# Patient Record
Sex: Female | Born: 1960 | Race: White | Hispanic: No | Marital: Married | Smoking: Former smoker
Health system: Southern US, Community
[De-identification: ages and names within clinical notes are randomized; demographics above are authoritative.]

## PROBLEM LIST (undated history)

## (undated) DIAGNOSIS — I639 Cerebral infarction, unspecified: Secondary | ICD-10-CM

## (undated) DIAGNOSIS — M8548 Solitary bone cyst, other site: Secondary | ICD-10-CM

## (undated) DIAGNOSIS — Z6833 Body mass index (BMI) 33.0-33.9, adult: Secondary | ICD-10-CM

## (undated) DIAGNOSIS — K219 Gastro-esophageal reflux disease without esophagitis: Secondary | ICD-10-CM

## (undated) DIAGNOSIS — M549 Dorsalgia, unspecified: Secondary | ICD-10-CM

## (undated) DIAGNOSIS — I341 Nonrheumatic mitral (valve) prolapse: Secondary | ICD-10-CM

## (undated) DIAGNOSIS — M4317 Spondylolisthesis, lumbosacral region: Secondary | ICD-10-CM

## (undated) DIAGNOSIS — M7138 Other bursal cyst, other site: Secondary | ICD-10-CM

## (undated) HISTORY — DX: Spondylolisthesis, lumbosacral region: M43.17

## (undated) HISTORY — PX: WISDOM TOOTH EXTRACTION: SHX21

## (undated) HISTORY — PX: VAGINAL HYSTERECTOMY: SUR661

## (undated) HISTORY — DX: Dorsalgia, unspecified: M54.9

## (undated) HISTORY — DX: Solitary bone cyst, other site: M85.48

## (undated) HISTORY — DX: Body mass index (BMI) 33.0-33.9, adult: Z68.33

## (undated) HISTORY — PX: COLONOSCOPY: SHX174

## (undated) HISTORY — PX: LAPAROSCOPY: SHX197

## (undated) HISTORY — DX: Other bursal cyst, other site: M71.38

---

## 1997-07-17 ENCOUNTER — Inpatient Hospital Stay (HOSPITAL_COMMUNITY): Admission: EM | Admit: 1997-07-17 | Discharge: 1997-07-19 | Payer: Self-pay | Admitting: *Deleted

## 1997-08-09 ENCOUNTER — Ambulatory Visit (HOSPITAL_COMMUNITY): Admission: RE | Admit: 1997-08-09 | Discharge: 1997-08-09 | Payer: Self-pay | Admitting: Neurology

## 1998-10-30 ENCOUNTER — Other Ambulatory Visit: Admission: RE | Admit: 1998-10-30 | Discharge: 1998-10-30 | Payer: Self-pay | Admitting: Family Medicine

## 1999-09-18 ENCOUNTER — Encounter: Payer: Self-pay | Admitting: Family Medicine

## 1999-09-18 ENCOUNTER — Encounter: Admission: RE | Admit: 1999-09-18 | Discharge: 1999-09-18 | Payer: Self-pay | Admitting: Family Medicine

## 2010-01-05 ENCOUNTER — Ambulatory Visit (HOSPITAL_COMMUNITY): Admission: RE | Admit: 2010-01-05 | Discharge: 2010-01-05 | Payer: Self-pay | Admitting: Obstetrics and Gynecology

## 2010-02-17 ENCOUNTER — Encounter (INDEPENDENT_AMBULATORY_CARE_PROVIDER_SITE_OTHER): Payer: Self-pay | Admitting: Obstetrics and Gynecology

## 2010-02-17 ENCOUNTER — Ambulatory Visit (HOSPITAL_COMMUNITY)
Admission: RE | Admit: 2010-02-17 | Discharge: 2010-02-18 | Payer: Self-pay | Source: Home / Self Care | Attending: Obstetrics and Gynecology | Admitting: Obstetrics and Gynecology

## 2010-05-19 LAB — CBC
HCT: 26 % — ABNORMAL LOW (ref 36.0–46.0)
HCT: 34.9 % — ABNORMAL LOW (ref 36.0–46.0)
Hemoglobin: 11.7 g/dL — ABNORMAL LOW (ref 12.0–15.0)
Hemoglobin: 8.8 g/dL — ABNORMAL LOW (ref 12.0–15.0)
MCH: 28.8 pg (ref 26.0–34.0)
MCH: 29.3 pg (ref 26.0–34.0)
MCHC: 33.6 g/dL (ref 30.0–36.0)
MCHC: 33.8 g/dL (ref 30.0–36.0)
MCV: 85.7 fL (ref 78.0–100.0)
MCV: 86.6 fL (ref 78.0–100.0)
Platelets: 218 10*3/uL (ref 150–400)
Platelets: 303 10*3/uL (ref 150–400)
RBC: 3.01 MIL/uL — ABNORMAL LOW (ref 3.87–5.11)
RBC: 4.07 MIL/uL (ref 3.87–5.11)
RDW: 15.2 % (ref 11.5–15.5)
RDW: 15.4 % (ref 11.5–15.5)
WBC: 11.9 10*3/uL — ABNORMAL HIGH (ref 4.0–10.5)
WBC: 6.8 10*3/uL (ref 4.0–10.5)

## 2010-05-19 LAB — PREGNANCY, URINE: Preg Test, Ur: NEGATIVE

## 2010-05-19 LAB — SURGICAL PCR SCREEN
MRSA, PCR: NEGATIVE
Staphylococcus aureus: NEGATIVE

## 2010-05-20 LAB — CBC
HCT: 38.4 % (ref 36.0–46.0)
MCHC: 33.3 g/dL (ref 30.0–36.0)
MCV: 85 fL (ref 78.0–100.0)
Platelets: 303 10*3/uL (ref 150–400)
RDW: 15.2 % (ref 11.5–15.5)
WBC: 5.9 10*3/uL (ref 4.0–10.5)

## 2010-05-20 LAB — PREGNANCY, URINE: Preg Test, Ur: NEGATIVE

## 2011-01-15 ENCOUNTER — Other Ambulatory Visit: Payer: Self-pay | Admitting: Obstetrics and Gynecology

## 2013-07-19 ENCOUNTER — Other Ambulatory Visit (HOSPITAL_BASED_OUTPATIENT_CLINIC_OR_DEPARTMENT_OTHER): Payer: Self-pay | Admitting: Physician Assistant

## 2013-07-19 DIAGNOSIS — R1310 Dysphagia, unspecified: Secondary | ICD-10-CM

## 2013-07-19 DIAGNOSIS — E038 Other specified hypothyroidism: Secondary | ICD-10-CM

## 2013-08-03 ENCOUNTER — Ambulatory Visit (HOSPITAL_BASED_OUTPATIENT_CLINIC_OR_DEPARTMENT_OTHER): Payer: 59

## 2013-08-09 ENCOUNTER — Other Ambulatory Visit: Payer: Self-pay | Admitting: Family Medicine

## 2013-08-09 DIAGNOSIS — R1310 Dysphagia, unspecified: Secondary | ICD-10-CM

## 2013-08-09 DIAGNOSIS — E038 Other specified hypothyroidism: Secondary | ICD-10-CM

## 2013-08-13 ENCOUNTER — Other Ambulatory Visit: Payer: 59

## 2013-08-17 ENCOUNTER — Ambulatory Visit
Admission: RE | Admit: 2013-08-17 | Discharge: 2013-08-17 | Disposition: A | Discharge status: Home / Self Care | Payer: 59 | Source: Ambulatory Visit | Attending: Family Medicine | Admitting: Family Medicine

## 2013-08-17 DIAGNOSIS — R1310 Dysphagia, unspecified: Secondary | ICD-10-CM

## 2013-08-17 DIAGNOSIS — E038 Other specified hypothyroidism: Secondary | ICD-10-CM

## 2014-02-15 ENCOUNTER — Other Ambulatory Visit: Payer: Self-pay | Admitting: Obstetrics and Gynecology

## 2014-02-18 LAB — CYTOLOGY - PAP

## 2016-02-12 ENCOUNTER — Ambulatory Visit (INDEPENDENT_AMBULATORY_CARE_PROVIDER_SITE_OTHER): Payer: Self-pay | Admitting: Orthopaedic Surgery

## 2017-10-20 ENCOUNTER — Ambulatory Visit (INDEPENDENT_AMBULATORY_CARE_PROVIDER_SITE_OTHER): Payer: Self-pay | Admitting: Orthopaedic Surgery

## 2017-11-23 ENCOUNTER — Other Ambulatory Visit: Payer: Self-pay | Admitting: Physician Assistant

## 2017-11-23 DIAGNOSIS — M545 Low back pain: Secondary | ICD-10-CM

## 2017-12-01 ENCOUNTER — Other Ambulatory Visit: Payer: Self-pay | Admitting: Physician Assistant

## 2017-12-01 DIAGNOSIS — S0542XA Penetrating wound of orbit with or without foreign body, left eye, initial encounter: Principal | ICD-10-CM

## 2017-12-01 DIAGNOSIS — S0541XA Penetrating wound of orbit with or without foreign body, right eye, initial encounter: Secondary | ICD-10-CM

## 2017-12-10 ENCOUNTER — Ambulatory Visit
Admission: RE | Admit: 2017-12-10 | Discharge: 2017-12-10 | Disposition: A | Discharge status: Home / Self Care | Payer: 59 | Source: Ambulatory Visit | Attending: Physician Assistant | Admitting: Physician Assistant

## 2017-12-10 ENCOUNTER — Other Ambulatory Visit: Payer: Self-pay

## 2017-12-10 DIAGNOSIS — S0542XA Penetrating wound of orbit with or without foreign body, left eye, initial encounter: Principal | ICD-10-CM

## 2017-12-10 DIAGNOSIS — S0541XA Penetrating wound of orbit with or without foreign body, right eye, initial encounter: Secondary | ICD-10-CM

## 2017-12-10 DIAGNOSIS — M545 Low back pain, unspecified: Secondary | ICD-10-CM

## 2018-01-30 ENCOUNTER — Telehealth: Payer: Self-pay

## 2018-01-30 NOTE — Telephone Encounter (Signed)
SENT REFERRAL TO SCHEDULING AND FILED NOTES 

## 2018-01-31 ENCOUNTER — Telehealth: Payer: Self-pay | Admitting: Cardiovascular Disease

## 2018-01-31 NOTE — Telephone Encounter (Signed)
New Message             [facility] Pre-operative Risk Assessment    Request for surgical clearance:  1. What type of surgery is being performed? Thoracic Spine Surgery   2. When is this surgery scheduled? Pending on clearance  What type of clearance is required (medical clearance vs. Pharmacy clearance to hold med vs. Both)? Neither  3. Are there any medications that need to be held prior to surgery and how long? None  4. Practice name and name of physician performing surgery? [facility]/Dr. Ophelia Charter  5. What is your office phone number [phone]   7.   What is your office fax number [phone]  8.   Anesthesia type (None, local, MAC, general) ? General   Erica Moss 01/31/2018, 10:24 AM  _________________________________________________________________   (provider comments below)

## 2018-01-31 NOTE — Telephone Encounter (Signed)
   Primary Cardiologist:  None   Chart reviewed as part of pre-operative protocol coverage.   This is a new patient. She will need an appointment before clearance can be provided.  It looks like a referral may have already been made.  Call back staff: 1. Please make sure the patient is getting set up for a NEW PATIENT appointment. 2. Route this note to the provider that sees her so that he/she can address surgical clearance at the time of the appointment.  This note will be removed from the Preop Pool. Tereso NewcomerScott Errin Chewning, PA-C  01/31/2018, 3:18 PM

## 2018-01-31 NOTE — Telephone Encounter (Signed)
LMOM for pt to call our to schedule new patient appt.

## 2018-02-01 NOTE — Telephone Encounter (Signed)
LMOVM TO CONTACT OFFICE TO SET APPOINTMENT WITH A CARDIOLOGIST

## 2018-02-07 NOTE — Progress Notes (Signed)
Cardiology Office Note   Date:  02/09/2018   ID:  Erica BrackenMichelle O Win, DOB 1960-08-12, MRN [id number]  PCP:  No primary care provider on file.  Cardiologist:   No primary care provider on file. Referring:  Tressie StalkerJenkins, Jeffrey, MD  Chief Complaint  Patient presents with  . Chest Pain    Tightness.      History of Present Illness: Erica Moss is a 57 y.o. female who is referred by Tressie StalkerJenkins, Jeffrey, MD for preop clearance prior to spine surgery.  She has no past cardiac history other than questionable mitral valve prolapse years ago.  She said she had a stroke about 25 years ago but it might of been related to she was smoking cigarettes and using birth control pills.  She had no residual from this.  She is otherwise had no cardiac work-up.  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  Because she was describing some chest discomfort and palpitation she was referred for preop evaluation.  She has lots of stress with her husband going through some health issues.  When she hears about these or thinks about these her heart will start racing and she might get short of breath.  It seems to be getting increased in frequency and intensity but so is the stress.  She elected passed away on its own.  She has not take any medications for this.  She denies any presyncope or syncope.  She does get some vague chest discomfort.  This is sporadic.  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  However, she has cardiovascular risk factors as described below.   Past Medical History:  Diagnosis Date  . Back pain   . Body mass index (bmi) 33.0-33.9, adult   . Cyst of thoracic facet joint   . Spondylolisthesis at L5-S1 level     Past Surgical History:  Procedure Laterality Date  . VAGINAL HYSTERECTOMY       No current outpatient medications on file.   No current  facility-administered medications for this visit.     Allergies:   Patient has no known allergies.    Social History:  The patient  reports that she has quit smoking. She has never used smokeless tobacco.   Family History:  The patient's family history includes Diabetes in her father; Heart attack (age of onset: 5550) in her father; Heart disease in her father; Heart failure in her sister; Kidney disease in her mother.    ROS:  Please see the history of present illness.   Otherwise, review of systems are positive for none.   All other systems are reviewed and negative.    PHYSICAL EXAM: VS:  BP (!) 140/92 (BP Location: Left Arm, Patient Position: Sitting, Cuff Size: Normal)   Pulse 89   Ht 5\' 2"  (1.575 m)   Wt 182 lb (82.6 kg)   BMI 33.29 kg/m  , BMI Body mass index is 33.29 kg/m. GENERAL:  Well appearing HEENT:  Pupils equal round and reactive, fundi not visualized, oral mucosa unremarkable NECK:  No jugular venous distention, waveform within normal limits, carotid upstroke brisk and symmetric, no bruits, no thyromegaly LYMPHATICS:  No cervical, inguinal adenopathy LUNGS:  Clear to auscultation bilaterally BACK:  No CVA tenderness CHEST:  Unremarkable HEART:  PMI not displaced or sustained,S1 and S2 within normal  limits, no S3, no S4, no clicks, no rubs, no murmurs ABD:  Flat, positive bowel sounds normal in frequency in pitch, no bruits, no rebound, no guarding, no midline pulsatile mass, no hepatomegaly, no splenomegaly EXT:  2 plus pulses throughout, no edema, no cyanosis no clubbing SKIN:  No rashes no nodules NEURO:  Cranial nerves II through XII grossly intact, motor grossly intact throughout PSYCH:  Cognitively intact, oriented to person place and time    EKG:  EKG is ordered today. The ekg ordered today demonstrates sinus rhythm, rate 90, axis within normal limits, intervals within normal limits, no acute ST-T wave changes.   Recent Labs: No results found for  requested labs within last 8760 hours.    Lipid Panel No results found for: CHOL, TRIG, HDL, CHOLHDL, VLDL, LDLCALC, LDLDIRECT    Wt Readings from Last 3 Encounters:  02/09/18 182 lb (82.6 kg)      Other studies Reviewed: Additional studies/ records that were reviewed today include: Labs. Review of the above records demonstrates:  Please see elsewhere in the note.     ASSESSMENT AND PLAN:  PREOP:   I will bring the patient back for a POET (Plain Old Exercise Test). This will allow me to screen for obstructive coronary disease, risk stratify and very importantly provide a prescription for exercise.  CHEST PAIN:  As above  HTN: This is unusual and typically not this elevated and she is going to keep an eye on this.  No change in medications at this point.  PALPITATIONS:  This is probably related to acute stress and anxiety.  At this point I do not think further cardiovascular testing is suggested but if it persists I could consider telemetry.  DYSLIPIDEMIA: She has a family history of early coronary artery disease.  While I do not think she needs medication therapy as an initial intervention she needs diet.  I would suggest the Mediterranean diet and she and I discussed this.   Current medicines are reviewed at length with the patient today.  The patient does not have concerns regarding medicines.  The following changes have been made:  no change  Labs/ tests ordered today include:   Orders Placed This Encounter  Procedures  . EXERCISE TOLERANCE TEST (ETT)  . EKG 12-Lead     Disposition:   FU with me as needed.      Signed, Rollene Rotunda, MD  02/09/2018 5:55 PM    [facility]

## 2018-02-07 NOTE — Telephone Encounter (Signed)
Faxed over surgical clearance back to requesting surgeon, [facility] to let them know we have left couple of messages for pt with no reply. Pt never been seen and will need new pt appt.  Not sure if insurance will require referral or not, will let requesting surgeons office handle.

## 2018-02-09 ENCOUNTER — Ambulatory Visit: Payer: 59 | Admitting: Cardiology

## 2018-02-09 ENCOUNTER — Encounter: Payer: Self-pay | Admitting: Cardiology

## 2018-02-09 VITALS — BP 140/92 | HR 89 | Ht 62.0 in | Wt 182.0 lb

## 2018-02-09 DIAGNOSIS — R002 Palpitations: Secondary | ICD-10-CM | POA: Diagnosis not present

## 2018-02-09 DIAGNOSIS — R072 Precordial pain: Secondary | ICD-10-CM

## 2018-02-09 DIAGNOSIS — Z01810 Encounter for preprocedural cardiovascular examination: Secondary | ICD-10-CM

## 2018-02-09 NOTE — Patient Instructions (Signed)
Medication Instructions:  Continue current medications  If you need a refill on your cardiac medications before your next appointment, please call your pharmacy.  Labwork: None ordered   If you have labs (blood work) drawn today and your tests are completely normal, you will receive your results only by: Marland Kitchen. MyChart Message (if you have MyChart) OR . A paper copy in the mail If you have any lab test that is abnormal or we need to change your treatment, we will call you to review the results.  Testing/Procedures: Your physician has requested that you have an exercise tolerance test. For further information please visit [email or web address]. Please also follow instruction sheet, as given.   Follow-Up: . You will need a follow up appointment in As Needed.    At [facility], you and your health needs are our priority.  As part of our continuing mission to provide you with exceptional heart care, we have created designated Provider Care Teams.  These Care Teams include your primary Cardiologist (physician) and Advanced Practice Providers (APPs -  Physician Assistants and Nurse Practitioners) who all work together to provide you with the care you need, when you need it.   Thank you for choosing [facility] at [facility]!!

## 2018-02-15 ENCOUNTER — Telehealth (HOSPITAL_COMMUNITY): Payer: Self-pay

## 2018-02-15 NOTE — Telephone Encounter (Signed)
Encounter complete. 

## 2018-02-16 ENCOUNTER — Telehealth (HOSPITAL_COMMUNITY): Payer: Self-pay

## 2018-02-16 NOTE — Telephone Encounter (Signed)
Encounter complete. 

## 2018-02-17 ENCOUNTER — Ambulatory Visit (HOSPITAL_COMMUNITY)
Admission: RE | Admit: 2018-02-17 | Discharge: 2018-02-17 | Disposition: A | Discharge status: Home / Self Care | Payer: 59 | Source: Ambulatory Visit | Attending: Cardiology | Admitting: Cardiology

## 2018-02-17 DIAGNOSIS — R072 Precordial pain: Secondary | ICD-10-CM | POA: Insufficient documentation

## 2018-02-17 LAB — EXERCISE TOLERANCE TEST
CSEPHR: 104 %
CSEPPHR: 173 {beats}/min
Estimated workload: 7 METS
Exercise duration (min): 6 min
Exercise duration (sec): 0 s
MPHR: 165 {beats}/min
RPE: 17
Rest HR: 77 {beats}/min

## 2018-02-21 NOTE — Telephone Encounter (Signed)
Patient called stating the surgeon's office has not received the clearance.   They will not let her have surgery without the clearance form. Patient had her stress test done this past Friday (12/13).

## 2018-02-21 NOTE — Telephone Encounter (Signed)
   Primary Cardiologist: Rollene RotundaJames Hochrein, MD  Chart reviewed as part of pre-operative protocol coverage. Given past medical history and time since last visit, based on ACC/AHA guidelines, Erica BrackenMichelle O Moss would be at acceptable risk for the planned procedure without further cardiovascular testing.  Seen and evaluated by Dr. Daiva NakayamaJ. Hochrein on 02/09/18 and had stress test 02/17/18 that was without ischemia and he believes her to be at acceptable risk.   I will route this recommendation to the requesting party via Epic fax function and remove from pre-op pool.  Please call with questions.  Nada BoozerLaura Ingold, NP 02/21/2018, 2:51 PM

## 2018-03-02 ENCOUNTER — Other Ambulatory Visit: Payer: Self-pay | Admitting: Neurosurgery

## 2018-03-10 ENCOUNTER — Other Ambulatory Visit: Payer: Self-pay | Admitting: Neurosurgery

## 2018-03-16 NOTE — Pre-Procedure Instructions (Signed)
AMONIE QUEBEDEAUX  03/16/2018      [facility] - [place] - [address] [place] [address] Phone: [phone] Fax: [phone]    Your procedure is scheduled on Wednesday January 22nd.  Report to [facility] Admitting at 1100 A.M.  Call this number if you have problems the morning of surgery:  [phone]   Remember:  Do not eat or drink after midnight.    Take these medicines the morning of surgery with A SIP OF WATER  famotidine (PEPCID) if needed  7 days prior to surgery STOP taking any Aspirin (unless otherwise instructed by your surgeon), Aleve, Naproxen, Ibuprofen, Motrin, Advil, Goody's, BC's, all herbal medications, fish oil, and all vitamins.     Do not wear jewelry, make-up or nail polish.  Do not wear lotions, powders, or perfumes, or deodorant.  Do not shave 48 hours prior to surgery.  Men may shave face and neck.  Do not bring valuables to the hospital.  [facility] is not responsible for any belongings or valuables.  Contacts, dentures or bridgework may not be worn into surgery.  Leave your suitcase in the car.  After surgery it may be brought to your room.  For patients admitted to the hospital, discharge time will be determined by your treatment team.  Patients discharged the day of surgery will not be allowed to drive home.    [facility]- Preparing For Surgery  Before surgery, you can play an important role. Because skin is not sterile, your skin needs to be as free of germs as possible. You can reduce the number of germs on your skin by washing with CHG (chlorahexidine gluconate) Soap before surgery.  CHG is an antiseptic cleaner which kills germs and bonds with the skin to continue killing germs even after washing.    Oral Hygiene is also important to reduce your risk of infection.  Remember - BRUSH YOUR TEETH THE MORNING OF SURGERY WITH YOUR REGULAR TOOTHPASTE  Please do not use if you have an  allergy to CHG or antibacterial soaps. If your skin becomes reddened/irritated stop using the CHG.  Do not shave (including legs and underarms) for at least 48 hours prior to first CHG shower. It is OK to shave your face.  Please follow these instructions carefully.   1. Shower the NIGHT BEFORE SURGERY and the MORNING OF SURGERY with CHG.   2. If you chose to wash your hair, wash your hair first as usual with your normal shampoo.  3. After you shampoo, rinse your hair and body thoroughly to remove the shampoo.  4. Use CHG as you would any other liquid soap. You can apply CHG directly to the skin and wash gently with a scrungie or a clean washcloth.   5. Apply the CHG Soap to your body ONLY FROM THE NECK DOWN.  Do not use on open wounds or open sores. Avoid contact with your eyes, ears, mouth and genitals (private parts). Wash Face and genitals (private parts)  with your normal soap.  6. Wash thoroughly, paying special attention to the area where your surgery will be performed.  7. Thoroughly rinse your body with warm water from the neck down.  8. DO NOT shower/wash with your normal soap after using and rinsing off the CHG Soap.  9. Pat yourself dry with a CLEAN TOWEL.  10. Wear CLEAN PAJAMAS to bed the night before surgery, wear comfortable clothes the morning  of surgery  11. Place CLEAN SHEETS on your bed the night of your first shower and DO NOT SLEEP WITH PETS.    Day of Surgery:  Do not apply any deodorants/lotions.  Please wear clean clothes to the hospital/surgery center.   Remember to brush your teeth WITH YOUR REGULAR TOOTHPASTE.    Please read over the following fact sheets that you were given.

## 2018-03-17 ENCOUNTER — Other Ambulatory Visit: Payer: Self-pay

## 2018-03-17 ENCOUNTER — Encounter (HOSPITAL_COMMUNITY): Payer: Self-pay

## 2018-03-17 ENCOUNTER — Encounter (HOSPITAL_COMMUNITY)
Admission: RE | Admit: 2018-03-17 | Discharge: 2018-03-17 | Disposition: A | Discharge status: Home / Self Care | Payer: 59 | Source: Ambulatory Visit | Attending: Neurosurgery | Admitting: Neurosurgery

## 2018-03-17 DIAGNOSIS — Z01812 Encounter for preprocedural laboratory examination: Secondary | ICD-10-CM | POA: Insufficient documentation

## 2018-03-17 HISTORY — DX: Nonrheumatic mitral (valve) prolapse: I34.1

## 2018-03-17 HISTORY — DX: Gastro-esophageal reflux disease without esophagitis: K21.9

## 2018-03-17 HISTORY — DX: Cerebral infarction, unspecified: I63.9

## 2018-03-17 LAB — CBC
HEMATOCRIT: 41.3 % (ref 36.0–46.0)
Hemoglobin: 12.5 g/dL (ref 12.0–15.0)
MCH: 26.7 pg (ref 26.0–34.0)
MCHC: 30.3 g/dL (ref 30.0–36.0)
MCV: 88.1 fL (ref 80.0–100.0)
NRBC: 0 % (ref 0.0–0.2)
PLATELETS: 288 10*3/uL (ref 150–400)
RBC: 4.69 MIL/uL (ref 3.87–5.11)
RDW: 13.5 % (ref 11.5–15.5)
WBC: 7.2 10*3/uL (ref 4.0–10.5)

## 2018-03-17 LAB — TYPE AND SCREEN
ABO/RH(D): B POS
Antibody Screen: NEGATIVE

## 2018-03-17 LAB — SURGICAL PCR SCREEN
MRSA, PCR: NEGATIVE
Staphylococcus aureus: NEGATIVE

## 2018-03-17 LAB — ABO/RH: ABO/RH(D): B POS

## 2018-03-17 NOTE — Progress Notes (Signed)
PCP - Dr. Henrine Screws MD Cardiologist - Dr. Antoine Poche  Chest x-ray - N/A EKG - 02/09/18 Stress Test - 02/2018  Blood Thinner Instructions: N/A Aspirin Instructions: N/A  Anesthesia review: none  Patient denies shortness of breath, fever, cough and chest pain at PAT appointment   Patient verbalized understanding of instructions that were given to them at the PAT appointment. Patient was also instructed that they will need to review over the PAT instructions again at home before surgery.

## 2018-03-29 ENCOUNTER — Inpatient Hospital Stay (HOSPITAL_COMMUNITY): Payer: 59 | Admitting: Anesthesiology

## 2018-03-29 ENCOUNTER — Inpatient Hospital Stay (HOSPITAL_COMMUNITY): Payer: 59

## 2018-03-29 ENCOUNTER — Inpatient Hospital Stay (HOSPITAL_COMMUNITY)
Admission: RE | Admit: 2018-03-29 | Discharge: 2018-03-30 | DRG: 030 | Disposition: A | Discharge status: Home / Self Care | Payer: 59 | Attending: Neurosurgery | Admitting: Neurosurgery

## 2018-03-29 ENCOUNTER — Encounter (HOSPITAL_COMMUNITY)
Admission: RE | Disposition: A | Discharge status: Home / Self Care | Payer: Self-pay | Source: Home / Self Care | Attending: Neurosurgery

## 2018-03-29 ENCOUNTER — Encounter (HOSPITAL_COMMUNITY): Payer: Self-pay

## 2018-03-29 ENCOUNTER — Other Ambulatory Visit: Payer: Self-pay

## 2018-03-29 DIAGNOSIS — M4317 Spondylolisthesis, lumbosacral region: Secondary | ICD-10-CM | POA: Diagnosis present

## 2018-03-29 DIAGNOSIS — K219 Gastro-esophageal reflux disease without esophagitis: Secondary | ICD-10-CM | POA: Diagnosis present

## 2018-03-29 DIAGNOSIS — I341 Nonrheumatic mitral (valve) prolapse: Secondary | ICD-10-CM | POA: Diagnosis present

## 2018-03-29 DIAGNOSIS — Z8673 Personal history of transient ischemic attack (TIA), and cerebral infarction without residual deficits: Secondary | ICD-10-CM

## 2018-03-29 DIAGNOSIS — Z79899 Other long term (current) drug therapy: Secondary | ICD-10-CM | POA: Diagnosis not present

## 2018-03-29 DIAGNOSIS — Z90710 Acquired absence of both cervix and uterus: Secondary | ICD-10-CM | POA: Diagnosis not present

## 2018-03-29 DIAGNOSIS — Z419 Encounter for procedure for purposes other than remedying health state, unspecified: Secondary | ICD-10-CM

## 2018-03-29 DIAGNOSIS — G9589 Other specified diseases of spinal cord: Secondary | ICD-10-CM | POA: Diagnosis present

## 2018-03-29 DIAGNOSIS — Z841 Family history of disorders of kidney and ureter: Secondary | ICD-10-CM | POA: Diagnosis not present

## 2018-03-29 DIAGNOSIS — Z8249 Family history of ischemic heart disease and other diseases of the circulatory system: Secondary | ICD-10-CM | POA: Diagnosis not present

## 2018-03-29 DIAGNOSIS — Z87891 Personal history of nicotine dependence: Secondary | ICD-10-CM | POA: Diagnosis not present

## 2018-03-29 DIAGNOSIS — M546 Pain in thoracic spine: Secondary | ICD-10-CM | POA: Diagnosis present

## 2018-03-29 DIAGNOSIS — Z833 Family history of diabetes mellitus: Secondary | ICD-10-CM

## 2018-03-29 DIAGNOSIS — G9619 Other disorders of meninges, not elsewhere classified: Principal | ICD-10-CM | POA: Diagnosis present

## 2018-03-29 DIAGNOSIS — G96198 Other disorders of meninges, not elsewhere classified: Secondary | ICD-10-CM | POA: Diagnosis present

## 2018-03-29 HISTORY — PX: LAMINECTOMY: SHX219

## 2018-03-29 SURGERY — THORACIC LAMINECTOMY FOR TUMOR
Anesthesia: General

## 2018-03-29 MED ORDER — MIDAZOLAM HCL 2 MG/2ML IJ SOLN
INTRAMUSCULAR | Status: AC
  Filled 2018-03-29: qty 2

## 2018-03-29 MED ORDER — VANCOMYCIN HCL 1000 MG IV SOLR
INTRAVENOUS | Status: AC
  Filled 2018-03-29: qty 1000

## 2018-03-29 MED ORDER — THROMBIN 5000 UNITS EX SOLR
OROMUCOSAL | Status: DC | PRN
  Administered 2018-03-29: 13:00:00 via TOPICAL

## 2018-03-29 MED ORDER — FAMOTIDINE 20 MG PO TABS
20.0000 mg | ORAL_TABLET | Freq: Every day | ORAL | Status: DC | PRN
  Administered 2018-03-30: 20 mg via ORAL
  Filled 2018-03-29: qty 1

## 2018-03-29 MED ORDER — CEFAZOLIN SODIUM-DEXTROSE 2-4 GM/100ML-% IV SOLN
2.0000 g | INTRAVENOUS | Status: AC
  Administered 2018-03-29: 2 g via INTRAVENOUS
  Filled 2018-03-29: qty 100

## 2018-03-29 MED ORDER — MIDAZOLAM HCL 2 MG/2ML IJ SOLN
INTRAMUSCULAR | Status: DC | PRN
  Administered 2018-03-29: 2 mg via INTRAVENOUS

## 2018-03-29 MED ORDER — CHLORHEXIDINE GLUCONATE CLOTH 2 % EX PADS: 6.0000 | MEDICATED_PAD | Freq: Once | CUTANEOUS | Status: DC

## 2018-03-29 MED ORDER — FENTANYL CITRATE (PF) 250 MCG/5ML IJ SOLN
INTRAMUSCULAR | Status: AC
  Filled 2018-03-29: qty 5

## 2018-03-29 MED ORDER — FENTANYL CITRATE (PF) 100 MCG/2ML IJ SOLN
INTRAMUSCULAR | Status: AC
  Administered 2018-03-29: 25 ug via INTRAVENOUS
  Filled 2018-03-29: qty 2

## 2018-03-29 MED ORDER — FENTANYL CITRATE (PF) 100 MCG/2ML IJ SOLN
INTRAMUSCULAR | Status: DC | PRN
  Administered 2018-03-29: 100 ug via INTRAVENOUS
  Administered 2018-03-29: 50 ug via INTRAVENOUS
  Administered 2018-03-29: 100 ug via INTRAVENOUS
  Administered 2018-03-29: 50 ug via INTRAVENOUS

## 2018-03-29 MED ORDER — PROPOFOL 10 MG/ML IV BOLUS
INTRAVENOUS | Status: DC | PRN
  Administered 2018-03-29: 200 mg via INTRAVENOUS

## 2018-03-29 MED ORDER — OXYCODONE HCL 5 MG PO TABS
5.0000 mg | ORAL_TABLET | ORAL | Status: DC | PRN
  Administered 2018-03-29 – 2018-03-30 (×3): 5 mg via ORAL
  Filled 2018-03-29 (×3): qty 1

## 2018-03-29 MED ORDER — BUPIVACAINE LIPOSOME 1.3 % IJ SUSP
20.0000 mL | INTRAMUSCULAR | Status: DC
  Filled 2018-03-29: qty 20

## 2018-03-29 MED ORDER — CEFAZOLIN SODIUM-DEXTROSE 2-4 GM/100ML-% IV SOLN
2.0000 g | Freq: Three times a day (TID) | INTRAVENOUS | Status: AC
  Administered 2018-03-29 – 2018-03-30 (×2): 2 g via INTRAVENOUS
  Filled 2018-03-29: qty 100

## 2018-03-29 MED ORDER — SODIUM CHLORIDE 0.9% FLUSH: 3.0000 mL | Freq: Two times a day (BID) | INTRAVENOUS | Status: DC

## 2018-03-29 MED ORDER — PROMETHAZINE HCL 25 MG/ML IJ SOLN: 6.2500 mg | INTRAMUSCULAR | Status: DC | PRN

## 2018-03-29 MED ORDER — DEXAMETHASONE SODIUM PHOSPHATE 10 MG/ML IJ SOLN
INTRAMUSCULAR | Status: DC | PRN
  Administered 2018-03-29: 10 mg via INTRAVENOUS

## 2018-03-29 MED ORDER — PHENOL 1.4 % MT LIQD: 1.0000 | OROMUCOSAL | Status: DC | PRN

## 2018-03-29 MED ORDER — MORPHINE SULFATE (PF) 4 MG/ML IV SOLN: 4.0000 mg | INTRAVENOUS | Status: DC | PRN

## 2018-03-29 MED ORDER — ONDANSETRON HCL 4 MG/2ML IJ SOLN: 4.0000 mg | Freq: Four times a day (QID) | INTRAMUSCULAR | Status: DC | PRN

## 2018-03-29 MED ORDER — ROCURONIUM BROMIDE 50 MG/5ML IV SOSY
PREFILLED_SYRINGE | INTRAVENOUS | Status: AC
  Filled 2018-03-29: qty 5

## 2018-03-29 MED ORDER — ZOLPIDEM TARTRATE 5 MG PO TABS: 5.0000 mg | ORAL_TABLET | Freq: Every evening | ORAL | Status: DC | PRN

## 2018-03-29 MED ORDER — OXYCODONE HCL 5 MG PO TABS: 5.0000 mg | ORAL_TABLET | Freq: Once | ORAL | Status: DC | PRN

## 2018-03-29 MED ORDER — SODIUM CHLORIDE 0.9% FLUSH: 3.0000 mL | INTRAVENOUS | Status: DC | PRN

## 2018-03-29 MED ORDER — BUPIVACAINE-EPINEPHRINE (PF) 0.25% -1:200000 IJ SOLN
INTRAMUSCULAR | Status: AC
  Filled 2018-03-29: qty 30

## 2018-03-29 MED ORDER — ROCURONIUM BROMIDE 50 MG/5ML IV SOSY
PREFILLED_SYRINGE | INTRAVENOUS | Status: DC | PRN
  Administered 2018-03-29: 50 mg via INTRAVENOUS
  Administered 2018-03-29 (×2): 20 mg via INTRAVENOUS

## 2018-03-29 MED ORDER — BACITRACIN ZINC 500 UNIT/GM EX OINT
TOPICAL_OINTMENT | CUTANEOUS | Status: AC
  Filled 2018-03-29: qty 28.35

## 2018-03-29 MED ORDER — SUGAMMADEX SODIUM 200 MG/2ML IV SOLN
INTRAVENOUS | Status: DC | PRN
  Administered 2018-03-29: 200 mg via INTRAVENOUS

## 2018-03-29 MED ORDER — THROMBIN 5000 UNITS EX SOLR
CUTANEOUS | Status: AC
  Filled 2018-03-29: qty 10000

## 2018-03-29 MED ORDER — BISACODYL 10 MG RE SUPP: 10.0000 mg | Freq: Every day | RECTAL | Status: DC | PRN

## 2018-03-29 MED ORDER — ACETAMINOPHEN 325 MG PO TABS: 650.0000 mg | ORAL_TABLET | ORAL | Status: DC | PRN

## 2018-03-29 MED ORDER — MENTHOL 3 MG MT LOZG: 1.0000 | LOZENGE | OROMUCOSAL | Status: DC | PRN

## 2018-03-29 MED ORDER — SODIUM CHLORIDE 0.9 % IV SOLN
INTRAVENOUS | Status: DC | PRN
  Administered 2018-03-29: 13:00:00

## 2018-03-29 MED ORDER — OXYCODONE HCL 5 MG/5ML PO SOLN: 5.0000 mg | Freq: Once | ORAL | Status: DC | PRN

## 2018-03-29 MED ORDER — ONDANSETRON HCL 4 MG PO TABS: 4.0000 mg | ORAL_TABLET | Freq: Four times a day (QID) | ORAL | Status: DC | PRN

## 2018-03-29 MED ORDER — ACETAMINOPHEN 650 MG RE SUPP: 650.0000 mg | RECTAL | Status: DC | PRN

## 2018-03-29 MED ORDER — LIDOCAINE 2% (20 MG/ML) 5 ML SYRINGE
INTRAMUSCULAR | Status: AC
  Filled 2018-03-29: qty 5

## 2018-03-29 MED ORDER — SODIUM CHLORIDE 0.9 % IV SOLN: 250.0000 mL | INTRAVENOUS | Status: DC

## 2018-03-29 MED ORDER — ACETAMINOPHEN 500 MG PO TABS
1000.0000 mg | ORAL_TABLET | Freq: Four times a day (QID) | ORAL | Status: DC
  Administered 2018-03-29 – 2018-03-30 (×3): 1000 mg via ORAL
  Filled 2018-03-29 (×3): qty 2

## 2018-03-29 MED ORDER — FENTANYL CITRATE (PF) 100 MCG/2ML IJ SOLN
25.0000 ug | INTRAMUSCULAR | Status: DC | PRN
  Administered 2018-03-29: 50 ug via INTRAVENOUS
  Administered 2018-03-29: 25 ug via INTRAVENOUS

## 2018-03-29 MED ORDER — 0.9 % SODIUM CHLORIDE (POUR BTL) OPTIME
TOPICAL | Status: DC | PRN
  Administered 2018-03-29: 1000 mL

## 2018-03-29 MED ORDER — DOCUSATE SODIUM 100 MG PO CAPS
100.0000 mg | ORAL_CAPSULE | Freq: Two times a day (BID) | ORAL | Status: DC
  Administered 2018-03-29: 100 mg via ORAL
  Filled 2018-03-29: qty 1

## 2018-03-29 MED ORDER — ONDANSETRON HCL 4 MG/2ML IJ SOLN
INTRAMUSCULAR | Status: AC
  Filled 2018-03-29: qty 2

## 2018-03-29 MED ORDER — BACITRACIN ZINC 500 UNIT/GM EX OINT
TOPICAL_OINTMENT | CUTANEOUS | Status: DC | PRN
  Administered 2018-03-29: 1 via TOPICAL

## 2018-03-29 MED ORDER — HEMOSTATIC AGENTS (NO CHARGE) OPTIME
TOPICAL | Status: DC | PRN
  Administered 2018-03-29: 1 via TOPICAL

## 2018-03-29 MED ORDER — PROPOFOL 10 MG/ML IV BOLUS
INTRAVENOUS | Status: AC
  Filled 2018-03-29: qty 20

## 2018-03-29 MED ORDER — CYCLOBENZAPRINE HCL 10 MG PO TABS: 10.0000 mg | ORAL_TABLET | Freq: Three times a day (TID) | ORAL | Status: DC | PRN

## 2018-03-29 MED ORDER — ONDANSETRON HCL 4 MG/2ML IJ SOLN
INTRAMUSCULAR | Status: DC | PRN
  Administered 2018-03-29: 4 mg via INTRAVENOUS

## 2018-03-29 MED ORDER — OXYCODONE HCL 5 MG PO TABS: 10.0000 mg | ORAL_TABLET | ORAL | Status: DC | PRN

## 2018-03-29 MED ORDER — LACTATED RINGERS IV SOLN
INTRAVENOUS | Status: DC
  Administered 2018-03-29 (×2): via INTRAVENOUS

## 2018-03-29 MED ORDER — LIDOCAINE 2% (20 MG/ML) 5 ML SYRINGE
INTRAMUSCULAR | Status: DC | PRN
  Administered 2018-03-29: 80 mg via INTRAVENOUS

## 2018-03-29 MED ORDER — EPHEDRINE 5 MG/ML INJ
INTRAVENOUS | Status: AC
  Filled 2018-03-29: qty 10

## 2018-03-29 MED ORDER — EPHEDRINE SULFATE-NACL 50-0.9 MG/10ML-% IV SOSY
PREFILLED_SYRINGE | INTRAVENOUS | Status: DC | PRN
  Administered 2018-03-29: 10 mg via INTRAVENOUS

## 2018-03-29 MED ORDER — BUPIVACAINE-EPINEPHRINE (PF) 0.25% -1:200000 IJ SOLN
INTRAMUSCULAR | Status: DC | PRN
  Administered 2018-03-29: 10 mL via PERINEURAL

## 2018-03-29 SURGICAL SUPPLY — 86 items
ADH SKN CLS APL DERMABOND .7 (GAUZE/BANDAGES/DRESSINGS)
APL SRG 60D 8 XTD TIP BNDBL (TIP) ×1
BAG DECANTER FOR FLEXI CONT (MISCELLANEOUS) ×3 IMPLANT
BALL CTTN LRG ABS STRL LF (GAUZE/BANDAGES/DRESSINGS)
BENZOIN TINCTURE PRP APPL 2/3 (GAUZE/BANDAGES/DRESSINGS) ×3 IMPLANT
BIT DRILL NEURO 2X3.1 SFT TUCH (MISCELLANEOUS) IMPLANT
BLADE CLIPPER SURG (BLADE) IMPLANT
BLADE SURG 11 STRL SS (BLADE) IMPLANT
BLADE ULTRA TIP 2M (BLADE) IMPLANT
BUR MATCHSTICK NEURO 3.0 LAGG (BURR) ×3 IMPLANT
BUR PRECISION FLUTE 6.0 (BURR) ×3 IMPLANT
CANISTER SUCT 3000ML PPV (MISCELLANEOUS) ×3 IMPLANT
CARTRIDGE OIL MAESTRO DRILL (MISCELLANEOUS) ×1 IMPLANT
CLIP VESOCCLUDE MED 6/CT (CLIP) IMPLANT
CLOSURE WOUND 1/2 X4 (GAUZE/BANDAGES/DRESSINGS) ×1
COTTONBALL LRG STERILE PKG (GAUZE/BANDAGES/DRESSINGS) IMPLANT
COVER MAYO STAND STRL (DRAPES) IMPLANT
COVER WAND RF STERILE (DRAPES) ×3 IMPLANT
DERMABOND ADVANCED (GAUZE/BANDAGES/DRESSINGS)
DERMABOND ADVANCED .7 DNX12 (GAUZE/BANDAGES/DRESSINGS) IMPLANT
DIFFUSER DRILL AIR PNEUMATIC (MISCELLANEOUS) ×3 IMPLANT
DRAPE C-ARM 42X72 X-RAY (DRAPES) ×6 IMPLANT
DRAPE CAMERA VIDEO/LASER (DRAPES) IMPLANT
DRAPE LAPAROTOMY 100X72 PEDS (DRAPES) IMPLANT
DRAPE LAPAROTOMY 100X72X124 (DRAPES) ×3 IMPLANT
DRAPE MICROSCOPE LEICA (MISCELLANEOUS) ×3 IMPLANT
DRAPE POUCH INSTRU U-SHP 10X18 (DRAPES) ×3 IMPLANT
DRAPE SURG 17X23 STRL (DRAPES) ×12 IMPLANT
DRILL NEURO 2X3.1 SOFT TOUCH (MISCELLANEOUS)
DRSG OPSITE POSTOP 4X8 (GAUZE/BANDAGES/DRESSINGS) ×3 IMPLANT
DURASEAL APPLICATOR TIP (TIP) ×3 IMPLANT
DURASEAL SPINE SEALANT 3ML (MISCELLANEOUS) ×3 IMPLANT
ELECT REM PT RETURN 9FT ADLT (ELECTROSURGICAL) ×3
ELECTRODE REM PT RTRN 9FT ADLT (ELECTROSURGICAL) ×1 IMPLANT
EVACUATOR SILICONE 100CC (DRAIN) IMPLANT
GAUZE 4X4 16PLY RFD (DISPOSABLE) IMPLANT
GAUZE SPONGE 4X4 12PLY STRL (GAUZE/BANDAGES/DRESSINGS) ×3 IMPLANT
GLOVE BIO SURGEON STRL SZ 6.5 (GLOVE) ×2 IMPLANT
GLOVE BIO SURGEON STRL SZ8 (GLOVE) ×6 IMPLANT
GLOVE BIO SURGEON STRL SZ8.5 (GLOVE) ×3 IMPLANT
GLOVE BIO SURGEONS STRL SZ 6.5 (GLOVE) ×1
GLOVE BIOGEL PI IND STRL 6.5 (GLOVE) ×1 IMPLANT
GLOVE BIOGEL PI IND STRL 7.0 (GLOVE) ×1 IMPLANT
GLOVE BIOGEL PI IND STRL 7.5 (GLOVE) ×2 IMPLANT
GLOVE BIOGEL PI IND STRL 8 (GLOVE) ×1 IMPLANT
GLOVE BIOGEL PI INDICATOR 6.5 (GLOVE) ×2
GLOVE BIOGEL PI INDICATOR 7.0 (GLOVE) ×2
GLOVE BIOGEL PI INDICATOR 7.5 (GLOVE) ×4
GLOVE BIOGEL PI INDICATOR 8 (GLOVE) ×2
GLOVE EXAM NITRILE XL STR (GLOVE) IMPLANT
GLOVE SURG SS PI 7.0 STRL IVOR (GLOVE) ×9 IMPLANT
GOWN STRL REUS W/ TWL LRG LVL3 (GOWN DISPOSABLE) ×1 IMPLANT
GOWN STRL REUS W/ TWL XL LVL3 (GOWN DISPOSABLE) ×2 IMPLANT
GOWN STRL REUS W/TWL LRG LVL3 (GOWN DISPOSABLE) ×3
GOWN STRL REUS W/TWL XL LVL3 (GOWN DISPOSABLE) ×6
KIT BASIN OR (CUSTOM PROCEDURE TRAY) ×3 IMPLANT
KIT TURNOVER KIT B (KITS) ×3 IMPLANT
NEEDLE HYPO 21X1.5 SAFETY (NEEDLE) IMPLANT
NEEDLE HYPO 22GX1.5 SAFETY (NEEDLE) ×3 IMPLANT
NEEDLE SPNL 18GX3.5 QUINCKE PK (NEEDLE) IMPLANT
NS IRRIG 1000ML POUR BTL (IV SOLUTION) ×3 IMPLANT
OIL CARTRIDGE MAESTRO DRILL (MISCELLANEOUS) ×3
PACK LAMINECTOMY NEURO (CUSTOM PROCEDURE TRAY) ×3 IMPLANT
PAD ARMBOARD 7.5X6 YLW CONV (MISCELLANEOUS) ×9 IMPLANT
PATTIES SURGICAL .25X.25 (GAUZE/BANDAGES/DRESSINGS) IMPLANT
PATTIES SURGICAL .5 X.5 (GAUZE/BANDAGES/DRESSINGS) IMPLANT
PATTIES SURGICAL .5 X3 (DISPOSABLE) ×3 IMPLANT
PATTIES SURGICAL 1/4 X 3 (GAUZE/BANDAGES/DRESSINGS) IMPLANT
PATTIES SURGICAL 1X1 (DISPOSABLE) IMPLANT
RUBBERBAND STERILE (MISCELLANEOUS) ×6 IMPLANT
SPONGE LAP 4X18 RFD (DISPOSABLE) IMPLANT
SPONGE NEURO XRAY DETECT 1X3 (DISPOSABLE) IMPLANT
STAPLER SKIN PROX WIDE 3.9 (STAPLE) ×3 IMPLANT
STRIP CLOSURE SKIN 1/2X4 (GAUZE/BANDAGES/DRESSINGS) ×2 IMPLANT
SUT ETHILON 3 0 FSL (SUTURE) ×3 IMPLANT
SUT NURALON 4 0 TR CR/8 (SUTURE) ×3 IMPLANT
SUT PROLENE 6 0 BV (SUTURE) ×3 IMPLANT
SUT SILK 3 0 TIES 17X18 (SUTURE)
SUT SILK 3-0 18XBRD TIE BLK (SUTURE) IMPLANT
SUT VIC AB 1 CT1 18XBRD ANBCTR (SUTURE) ×2 IMPLANT
SUT VIC AB 1 CT1 8-18 (SUTURE) ×4
SUT VIC AB 2-0 CP2 18 (SUTURE) ×6 IMPLANT
TOWEL GREEN STERILE (TOWEL DISPOSABLE) ×3 IMPLANT
TOWEL GREEN STERILE FF (TOWEL DISPOSABLE) ×3 IMPLANT
TRAY FOLEY MTR SLVR 16FR STAT (SET/KITS/TRAYS/PACK) ×3 IMPLANT
WATER STERILE IRR 1000ML POUR (IV SOLUTION) ×3 IMPLANT

## 2018-03-29 NOTE — Anesthesia Procedure Notes (Signed)
Procedure Name: Intubation Date/Time: 03/29/2018 12:55 PM Performed by: Barrington Ellison, CRNA Pre-anesthesia Checklist: Patient identified, Emergency Drugs available, Suction available and Patient being monitored Patient Re-evaluated:Patient Re-evaluated prior to induction Oxygen Delivery Method: Circle System Utilized Preoxygenation: Pre-oxygenation with 100% oxygen Induction Type: IV induction Ventilation: Mask ventilation without difficulty and Oral airway inserted - appropriate to patient size Laryngoscope Size: Mac and 3 Grade View: Grade I Tube type: Oral Tube size: 7.0 mm Number of attempts: 1 Airway Equipment and Method: Stylet and Oral airway Placement Confirmation: ETT inserted through vocal cords under direct vision,  positive ETCO2 and breath sounds checked- equal and bilateral Secured at: 21 cm Tube secured with: Tape Dental Injury: Teeth and Oropharynx as per pre-operative assessment

## 2018-03-29 NOTE — Anesthesia Postprocedure Evaluation (Signed)
Anesthesia Post Note  Patient: Erica Moss  Procedure(s) Performed: Thoracic nine to thoracic twelve Laminectomy for drainage of arachnoid cyst (N/A )     Patient location during evaluation: PACU Anesthesia Type: General Level of consciousness: awake and alert Pain management: pain level controlled Vital Signs Assessment: post-procedure vital signs reviewed and stable Respiratory status: spontaneous breathing, nonlabored ventilation and respiratory function stable Cardiovascular status: blood pressure returned to baseline and stable Postop Assessment: no apparent nausea or vomiting Anesthetic complications: no    Last Vitals:  Vitals:   03/29/18 1639 03/29/18 1709  BP: (!) 141/86 (!) 153/90  Pulse: 77 75  Resp: (!) 6 18  Temp: (!) 36.3 C 36.5 C  SpO2: 99% 100%    Last Pain:  Vitals:   03/29/18 1715  TempSrc:   PainSc: 2                  Beryle Lathe

## 2018-03-29 NOTE — Progress Notes (Signed)
Subjective: The patient is alert and pleasant.  She is in no apparent distress.  She looks well.  Objective: Vital signs in last 24 hours: Temp:  [97.5 F (36.4 C)-97.8 F (36.6 C)] 97.5 F (36.4 C) (01/22 1552) Pulse Rate:  [74-94] 77 (01/22 1620) Resp:  [8-20] 13 (01/22 1620) BP: (144-172)/(92-101) 145/92 (01/22 1620) SpO2:  [97 %-100 %] 97 % (01/22 1620) Weight:  [83.5 kg] 83.5 kg (01/22 1055) Estimated body mass index is 33.65 kg/m as calculated from the following:   Height as of this encounter: 5\' 2"  (1.575 m).   Weight as of this encounter: 83.5 kg.   Intake/Output from previous day: No intake/output data recorded. Intake/Output this shift: Total I/O In: 1400 [I.V.:1400] Out: 270 [Urine:120; Blood:150]  Physical exam the patient is alert and pleasant.  She is moving her lower extremities well.  Lab Results: No results for input(s): WBC, HGB, HCT, PLT in the last 72 hours. BMET No results for input(s): NA, K, CL, CO2, GLUCOSE, BUN, CREATININE, CALCIUM in the last 72 hours.  Studies/Results: No results found.  Assessment/Plan: The patient is doing well.  I spoke with her husband.  LOS: 0 days     Cristi Loron 03/29/2018, 4:23 PM

## 2018-03-29 NOTE — H&P (Signed)
Subjective: The patient is a 58 year old white female who has complained of thoracic spine pain.  She has failed medical management and was worked up with a thoracic MRI.  This demonstrated a thoracic arachnoid cyst displacing the spinal cord.  I discussed the various treatment options with her.  She has decided proceed with surgery.  Past Medical History:  Diagnosis Date  . Back pain   . Body mass index (bmi) 33.0-33.9, adult   . Cyst of thoracic facet joint   . GERD (gastroesophageal reflux disease)   . Mitral valve prolapse   . Spondylolisthesis at L5-S1 level   . Stroke [facility])     Past Surgical History:  Procedure Laterality Date  . COLONOSCOPY    . LAPAROSCOPY     for endometriosis  . VAGINAL HYSTERECTOMY    . WISDOM TOOTH EXTRACTION      No Known Allergies  Social History   Tobacco Use  . Smoking status: Former Games developer  . Smokeless tobacco: Never Used  . Tobacco comment: quit 13 years ago  Substance Use Topics  . Alcohol use: Yes    Alcohol/week: 1.0 standard drinks    Types: 1 Shots of liquor per week    Comment: socially    Family History  Problem Relation Age of Onset  . Kidney disease Mother   . Heart disease Father   . Diabetes Father   . Heart attack Father 22       Died of MI  . Heart failure Sister    Prior to Admission medications   Medication Sig Start Date End Date Taking? Authorizing Provider  famotidine (PEPCID) 20 MG tablet Take 20 mg by mouth daily as needed for heartburn or indigestion.   Yes [provider]     Review of Systems  Positive ROS: As above  All other systems have been reviewed and were otherwise negative with the exception of those mentioned in the HPI and as above.  Objective: Vital signs in last 24 hours: Temp:  [97.8 F (36.6 C)] 97.8 F (36.6 C) (01/22 1055) Pulse Rate:  [82] 82 (01/22 1055) Resp:  [20] 20 (01/22 1055) BP: (172)/(93) 172/93 (01/22 1055) SpO2:  [100 %] 100 % (01/22 1055) Weight:  [83.5 kg]  83.5 kg (01/22 1055) Estimated body mass index is 33.65 kg/m as calculated from the following:   Height as of this encounter: 5\' 2"  (1.575 m).   Weight as of this encounter: 83.5 kg.   General Appearance: Alert Head: Normocephalic, without obvious abnormality, atraumatic Eyes: PERRL, conjunctiva/corneas clear, EOM's intact,    Ears: Normal  Throat: Normal  Neck: Supple, Back: unremarkable Lungs: Clear to auscultation bilaterally, respirations unlabored Heart: Regular rate and rhythm, no murmur, rub or gallop Abdomen: Soft, non-tender Extremities: Extremities normal, atraumatic, no cyanosis or edema Skin: unremarkable  NEUROLOGIC:   Mental status: alert and oriented,Motor Exam - grossly normal Sensory Exam - grossly normal Reflexes: She is hyperreflexic in her lower extremities. Coordination - grossly normal Gait - grossly normal Balance - grossly normal Cranial Nerves: I: smell Not tested  II: visual acuity  OS: Normal  OD: Normal   II: visual fields Full to confrontation  II: pupils Equal, round, reactive to light  III,VII: ptosis None  III,IV,VI: extraocular muscles  Full ROM  V: mastication Normal  V: facial light touch sensation  Normal  V,VII: corneal reflex  Present  VII: facial muscle function - upper  Normal  VII: facial muscle function - lower Normal  VIII: hearing Not tested  IX: soft palate elevation  Normal  IX,X: gag reflex Present  XI: trapezius strength  5/5  XI: sternocleidomastoid strength 5/5  XI: neck flexion strength  5/5  XII: tongue strength  Normal    Data Review Lab Results  Component Value Date   WBC 7.2 03/17/2018   HGB 12.5 03/17/2018   HCT 41.3 03/17/2018   MCV 88.1 03/17/2018   PLT 288 03/17/2018   No results found for: NA, K, CL, CO2, BUN, CREATININE, GLUCOSE No results found for: INR, PROTIME  Assessment/Plan: Thoracic arachnoid cyst, thoracic spine pain, thoracic myelopathy: I have discussed the situation with the patient.   I reviewed her imaging studies with her and pointed out the abnormalities.  We have discussed the various treatment options including surgery.  I have described the surgical treatment option of a approximately T9-T12 laminectomy durotomy and drainage of arachnoid cyst.  I have shown her surgical models.  We have discussed the risks, benefits, alternatives, expected postop course, and likelihood of achieving our goals with surgery.  I have answered all her questions.  She has decided to proceed with surgery.   Cristi Loron 03/29/2018 12:02 PM

## 2018-03-29 NOTE — Op Note (Signed)
Brief history: The patient is a 58 year old white female who has complained of thoracic spine pain.  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  I discussed the various treatment options with her.  She has weighed the risks, benefits and alternatives of surgery and decided to proceed with a thoracic laminectomy for drainage of her thoracic arachnoid cyst.  Preoperative diagnosis: Thoracic arachnoid cyst, thoracic spine pain, thoracic myelopathy  Postop diagnosis: The same  Procedure: T9, T10, T11 and T12 laminectomy for durotomy and drainage of arachnoid cyst using microdissection  Surgeon: Dr. Delma OfficerJeff Yojan Paskett  Assistant: Hildred PriestMegan Bergman nurse practitioner  Anesthesia: General tracheal  Estimated blood loss: 100 cc  Specimens: None  Drains: None  Complications: None  Description of procedure: The patient was brought to the operating room by the anesthesia team.  General endotracheal anesthesia was induced.  She was turned to the prone position on the chest rolls.  Her thoracic region was then prepared with Betadine scrub and Betadine solution.  Sterile drapes were applied.  I then injected the area to be incised with Marcaine with epinephrine solution.  I used the scalpel to make a linear midline incision from approximately T9-T12.  I used electrocautery to perform a bilateral subperiosteal dissection exposing the spinous process and lamina of T9, T10, T11 and T12 bilaterally.  We used intraoperative fluoroscopy to confirm our location.  I inserted the Adson and cerebellar retractors for exposure.  I incised the interspinous ligament at T9-10, T10-11, T11-12 with electrocautery.  I used a Leksell rongeur to remove the spinous process of T9, T10 and T11.  We used a high-speed drill to drill away the lamina at T9, T10 and 11 and the cephalad aspect of T12.  We used the Kerrison punches to complete the laminectomy and remove  the ligamentum flavum from T12-T9.  We then brought the operative microscope into the field.  Under its magnification illumination we completed the microdissection.  We used a 15 blade scalpel to create a midline durotomy.  We used a Public house managerWoodson elevator to denude the durotomy both proximally and distally.  The arachnoid was intact.  We could see the arachnoid cyst through the arachnoid layer.  We tacked up the dural edges with 4-0 Nurolon suture.  I then used the microscissors to incise the arachnoid layer.  This gave exposure to the arachnoid cyst which I removed using the microscissors..  After were satisfied with the communication of the arachnoid cyst with the normal arachnoid we then reapproximated the patient's dura with a running 6-0 Prolene suture.  We had anesthesia Valsalva the patient the closure appeared good.  We placed DuraSeal over the durotomy.  We then remove the retractors.  We obtained hemostasis using bipolar electrocautery.  We then reapproximated patient's thoracic fascia with interrupted 0 Vicryl suture.  We reapproximated the subcutaneous tissue with interrupted 2-0 Vicryl suture.  We reapproximate the skin with Steri-Strips and benzoin.  The wound was then coated with bacitracin ointment.  A sterile dressing was applied.  The drapes were removed.  By report all sponge, instrument, and needle counts were correct at the end of this case.

## 2018-03-29 NOTE — Anesthesia Preprocedure Evaluation (Addendum)
Anesthesia Evaluation  Patient identified by MRN, date of birth, ID band Patient awake    Reviewed: Allergy & Precautions, NPO status , Patient's Chart, lab work & pertinent test results  History of Anesthesia Complications Negative for: history of anesthetic complications  Airway Mallampati: II  TM Distance: >3 FB Neck ROM: Full    Dental  (+) Dental Advisory Given, Teeth Intact   Pulmonary former smoker,    breath sounds clear to auscultation       Cardiovascular + Valvular Problems/Murmurs MVP  Rhythm:Regular Rate:Normal     Neuro/Psych CVA, No Residual Symptoms negative psych ROS   GI/Hepatic Neg liver ROS, GERD  Medicated and Controlled,  Endo/Other   Obesity   Renal/GU negative Renal ROS     Musculoskeletal negative musculoskeletal ROS (+)   Abdominal   Peds  Hematology negative hematology ROS (+)   Anesthesia Other Findings   Reproductive/Obstetrics                            Anesthesia Physical Anesthesia Plan  ASA: III  Anesthesia Plan: General   Post-op Pain Management:    Induction: Intravenous  PONV Risk Score and Plan: 3 and Treatment may vary due to age or medical condition, Ondansetron, Dexamethasone and Midazolam  Airway Management Planned: Oral ETT  Additional Equipment: None  Intra-op Plan:   Post-operative Plan: Extubation in OR  Informed Consent: I have reviewed the patients History and Physical, chart, labs and discussed the procedure including the risks, benefits and alternatives for the proposed anesthesia with the patient or authorized representative who has indicated his/her understanding and acceptance.     Dental advisory given  Plan Discussed with: CRNA and Anesthesiologist  Anesthesia Plan Comments:        Anesthesia Quick Evaluation

## 2018-03-29 NOTE — Transfer of Care (Signed)
Immediate Anesthesia Transfer of Care Note  Patient: Erica Moss  Procedure(s) Performed: Thoracic nine to thoracic twelve Laminectomy for drainage of arachnoid cyst (N/A )  Patient Location: PACU  Anesthesia Type:General  Level of Consciousness: awake, alert  and oriented  Airway & Oxygen Therapy: Patient Spontanous Breathing  Post-op Assessment: Report given to RN and Patient moving all extremities X 4  Post vital signs: Reviewed and stable  Last Vitals:  Vitals Value Taken Time  BP    Temp    Pulse 94 03/29/2018  3:51 PM  Resp 15 03/29/2018  3:51 PM  SpO2 99 % 03/29/2018  3:51 PM  Vitals shown include unvalidated device data.  Last Pain:  Vitals:   03/29/18 1133  TempSrc:   PainSc: 4       Patients Stated Pain Goal: 2 (03/29/18 1133)  Complications: No apparent anesthesia complications

## 2018-03-30 ENCOUNTER — Encounter (HOSPITAL_COMMUNITY): Payer: Self-pay | Admitting: Neurosurgery

## 2018-03-30 MED ORDER — OXYCODONE HCL 5 MG PO TABS: 5.0000 mg | ORAL_TABLET | ORAL | 0 refills | Status: DC | PRN

## 2018-03-30 MED ORDER — CYCLOBENZAPRINE HCL 10 MG PO TABS: 10.0000 mg | ORAL_TABLET | Freq: Three times a day (TID) | ORAL | 0 refills | Status: DC | PRN

## 2018-03-30 MED ORDER — DOCUSATE SODIUM 100 MG PO CAPS: 100.0000 mg | ORAL_CAPSULE | Freq: Two times a day (BID) | ORAL | 0 refills | Status: DC

## 2018-03-30 NOTE — Progress Notes (Signed)
Pt doing well. Pt given D/C instructions with verbal understanding. Rx's were sent to Pt's pharmacy by MD. Pt's incision is clean and dry with no sign of infection. Pt's IV was removed prior to D/C. Pt D/C'd home via wheelchair per MD order. Pt is stable @ D/C and has no other needs at this time. Joie Hipps, RN  

## 2018-03-30 NOTE — Evaluation (Signed)
Physical Therapy Evaluation and Discharge Patient Details Name: Erica Moss MRN: [id number] DOB: December 08, 1960 Today's Date: 03/30/2018   History of Present Illness  Pt is a 58 y/o female who presents s/p T9-T12 laminectomy for drainage of arachnoid cyst on 03/29/2018. PMH significant for CVA, mitral valve prolapse.  Clinical Impression  Patient evaluated by Physical Therapy with no further acute PT needs identified. All education has been completed and the patient has no further questions. At the time of PT eval pt was able to perform transfers and ambulation with gross modified independence and no AD. Pt was educated on precautions, activity progression, positioning recommendations, and potential work modifications to decrease risk for future injury. See below for any follow-up Physical Therapy or equipment needs. PT is signing off. Thank you for this referral.     Follow Up Recommendations No PT follow up;Supervision - Intermittent    Equipment Recommendations  None recommended by PT    Recommendations for Other Services       Precautions / Restrictions Precautions Precautions: Fall;Back Precaution Booklet Issued: Yes (comment) Precaution Comments: Reviewed in detail with pt, and she was cued for precautions during functional mobility.  Restrictions Weight Bearing Restrictions: No      Mobility  Bed Mobility Overal bed mobility: Modified Independent             General bed mobility comments: Pt demonstrated good technique for log roll with transition to and from EOB.   Transfers Overall transfer level: Modified independent Equipment used: None             General transfer comment: No assist required. Pt demonstrated good posture and utilized wide BOS for balance with stand>sit.   Ambulation/Gait Ambulation/Gait assistance: Modified independent (Device/Increase time) Gait Distance (Feet): 250 Feet Assistive device: None Gait Pattern/deviations: Step-through  pattern;Decreased stride length Gait velocity: Decreased Gait velocity interpretation: 1.31 - 2.62 ft/sec, indicative of limited community ambulator General Gait Details: Pt was able to maintain good upright posture throughout gait training. No unsteadiness or LOB noted.   Stairs         General stair comments: Pt declined stair training however we verbally reviewed sequencing and safety.  Wheelchair Mobility    Modified Rankin (Stroke Patients Only)       Balance Overall balance assessment: Needs assistance Sitting-balance support: Feet supported;No upper extremity supported Sitting balance-Leahy Scale: Good     Standing balance support: No upper extremity supported;During functional activity Standing balance-Leahy Scale: Fair                               Pertinent Vitals/Pain Pain Assessment: (incision site)    Home Living Family/patient expects to be discharged to:: Private residence Living Arrangements: Spouse/significant other Available Help at Discharge: Family;Available 24 hours/day Type of Home: House Home Access: Stairs to enter Entrance Stairs-Rails: Right;Left;Can reach both Entrance Stairs-Number of Steps: 3 Home Layout: One level Home Equipment: Shower seat      Prior Function Level of Independence: Independent         Comments: Works at a [employer], 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      Hand Dominance        Extremity/Trunk Assessment   Upper Extremity Assessment Upper Extremity Assessment: Overall WFL for tasks assessed    Lower Extremity Assessment Lower Extremity Assessment: Overall WFL for tasks assessed    Cervical / Trunk Assessment Cervical / Trunk  Assessment: Other exceptions Cervical / Trunk Exceptions: s/p surgery  Communication   Communication: No difficulties  Cognition Arousal/Alertness: Awake/alert Behavior During Therapy: WFL for tasks assessed/performed Overall Cognitive  Status: Within Functional Limits for tasks assessed                                        General Comments      Exercises     Assessment/Plan    PT Assessment Patent does not need any further PT services  PT Problem List         PT Treatment Interventions      PT Goals (Current goals can be found in the Care Plan section)  Acute Rehab PT Goals Patient Stated Goal: Home today, get back to work PT Goal Formulation: All assessment and education complete, DC therapy    Frequency     Barriers to discharge        Co-evaluation               AM-PAC PT "6 Clicks" Mobility  Outcome Measure Help needed turning from your back to your side while in a flat bed without using bedrails?: None Help needed moving from lying on your back to sitting on the side of a flat bed without using bedrails?: None Help needed moving to and from a bed to a chair (including a wheelchair)?: None Help needed standing up from a chair using your arms (e.g., wheelchair or bedside chair)?: None Help needed to walk in hospital room?: None Help needed climbing 3-5 steps with a railing? : None 6 Click Score: 24    End of Session Equipment Utilized During Treatment: Gait belt Activity Tolerance: Patient tolerated treatment well Patient left: in bed;with call bell/phone within reach Nurse Communication: Mobility status PT Visit Diagnosis: Unsteadiness on feet (R26.81);Pain;Other symptoms and signs involving the nervous system (R29.898) Pain - part of body: (back)    Time: 6948-5462 PT Time Calculation (min) (ACUTE ONLY): 18 min   Charges:   PT Evaluation $PT Eval Moderate Complexity: 1 Mod          Erica Moss, PT, DPT Acute Rehabilitation Services Pager: [phone] Office: [phone]   Erica Moss 03/30/2018, 1:53 PM

## 2018-03-30 NOTE — Discharge Summary (Signed)
Physician Discharge Summary  Patient ID: Erica BrackenMichelle O Stines MRN: [id number] DOB/AGE: 08-20-1960 58 y.o.  Admit date: 03/29/2018 Discharge date: 03/30/2018  Admission Diagnoses: Thoracic arachnoid cyst, thoracic spine pain, thoracic myelopathy  Discharge Diagnoses: The same Active Problems:   Spinal arachnoid cyst   Discharged Condition: good  Hospital Course: I performed a T9-T12 laminectomy, durotomy and drainage of arachnoid cyst on 03/29/2018.  The surgery went well.  The patient's postoperative course was unremarkable.  She is ambulating well and urinating well.  On postoperative day #1 she requested discharge to home.  She was given written and oral discharge instructions.  All her questions were answered.  Consults: Physical therapy Significant Diagnostic Studies: None Treatments: T9-12 laminectomy, durotomy, drainage of arachnoid cyst using microdissection Discharge Exam: Blood pressure 110/73, pulse 72, temperature 97.8 F (36.6 C), temperature source Oral, resp. rate 18, height 5\' 2"  (1.575 m), weight 83.5 kg, SpO2 97 %. The patient is alert and pleasant.  She looks well.  Her lower extremity strength is normal.  Disposition: Home  Discharge Instructions    Call MD for:  difficulty breathing, headache or visual disturbances   Complete by:  As directed    Call MD for:  extreme fatigue   Complete by:  As directed    Call MD for:  hives   Complete by:  As directed    Call MD for:  persistant dizziness or light-headedness   Complete by:  As directed    Call MD for:  persistant nausea and vomiting   Complete by:  As directed    Call MD for:  redness, tenderness, or signs of infection (pain, swelling, redness, odor or green/yellow discharge around incision site)   Complete by:  As directed    Call MD for:  severe uncontrolled pain   Complete by:  As directed    Call MD for:  temperature >100.4   Complete by:  As directed    Diet - low sodium heart healthy   Complete by:   As directed    Discharge instructions   Complete by:  As directed    Call [phone] for a followup appointment. Take a stool softener while you are using pain medications.   Driving Restrictions   Complete by:  As directed    Do not drive for 2 weeks.   Increase activity slowly   Complete by:  As directed    Lifting restrictions   Complete by:  As directed    Do not lift more than 5 pounds. No excessive bending or twisting.   May shower / Bathe   Complete by:  As directed    Remove the dressing for 3 days after surgery.  You may shower, but leave the incision alone.   Remove dressing in 48 hours   Complete by:  As directed    Your stitches are under the scan and will dissolve by themselves. The Steri-Strips will fall off after you take a few showers. Do not rub back or pick at the wound, Leave the wound alone.     Allergies as of 03/30/2018   No Known Allergies     Medication List    TAKE these medications   cyclobenzaprine 10 MG tablet Commonly known as:  FLEXERIL Take 1 tablet (10 mg total) by mouth 3 (three) times daily as needed for muscle spasms.   docusate sodium 100 MG capsule Commonly known as:  COLACE Take 1 capsule (100 mg total) by mouth 2 (two) times daily.  famotidine 20 MG tablet Commonly known as:  PEPCID Take 20 mg by mouth daily as needed for heartburn or indigestion.   oxyCODONE 5 MG immediate release tablet Commonly known as:  Oxy IR/ROXICODONE Take 1 tablet (5 mg total) by mouth every 4 (four) hours as needed for moderate pain ((score 4 to 6)).        Signed: Cristi Loron 03/30/2018, 9:38 AM

## 2020-08-16 IMAGING — RF DG THORACIC SPINE 2V
1 series · 2 of 2 positions shown · non-contrast
Comparison: MRI thoracic spine 12/10/2017

CLINICAL DATA: 57-year-old female undergoing T9-T12 laminectomy for
drainage of an arachnoid cyst.

EXAM:
DG C-ARM 61-120 MIN; THORACIC SPINE 2 VIEWS

[Series 1: run · 2 of 2 slices shown]
[im 1/2]
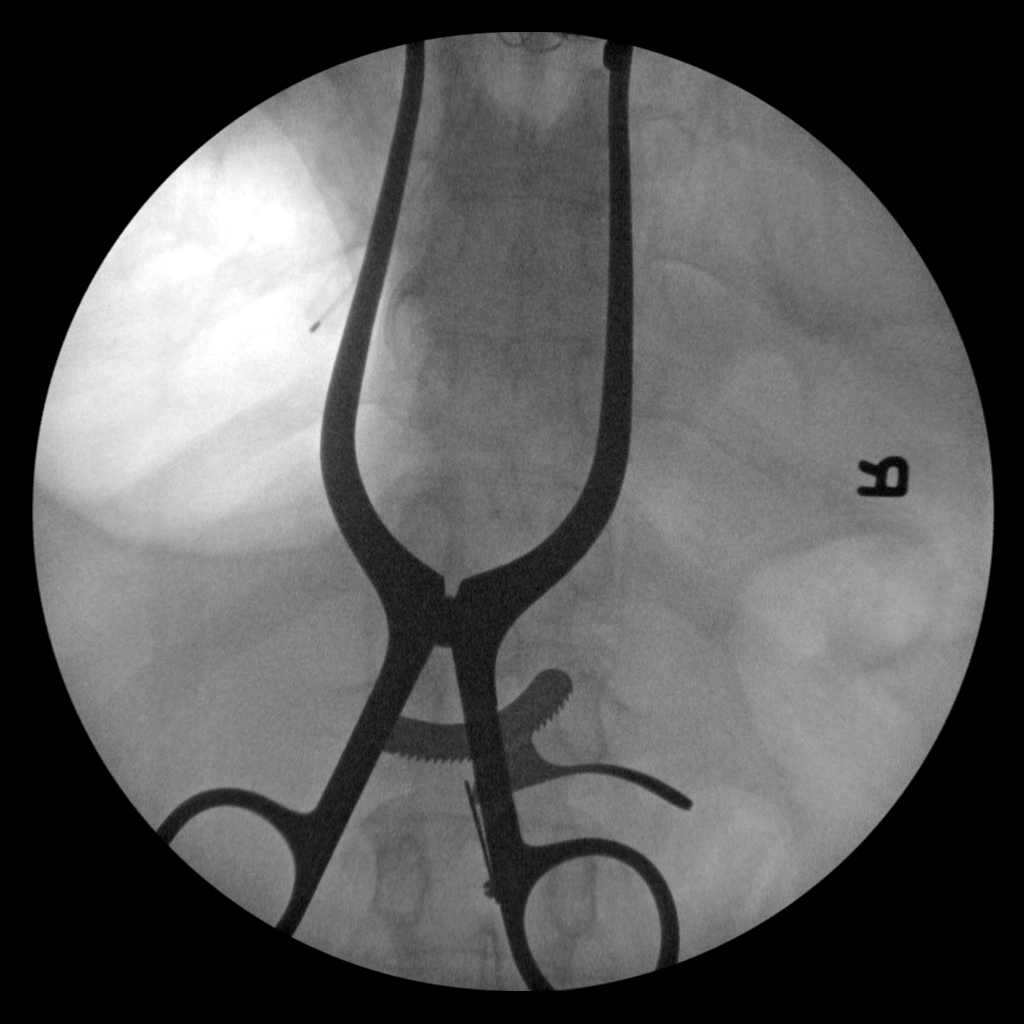
[im 2/2]
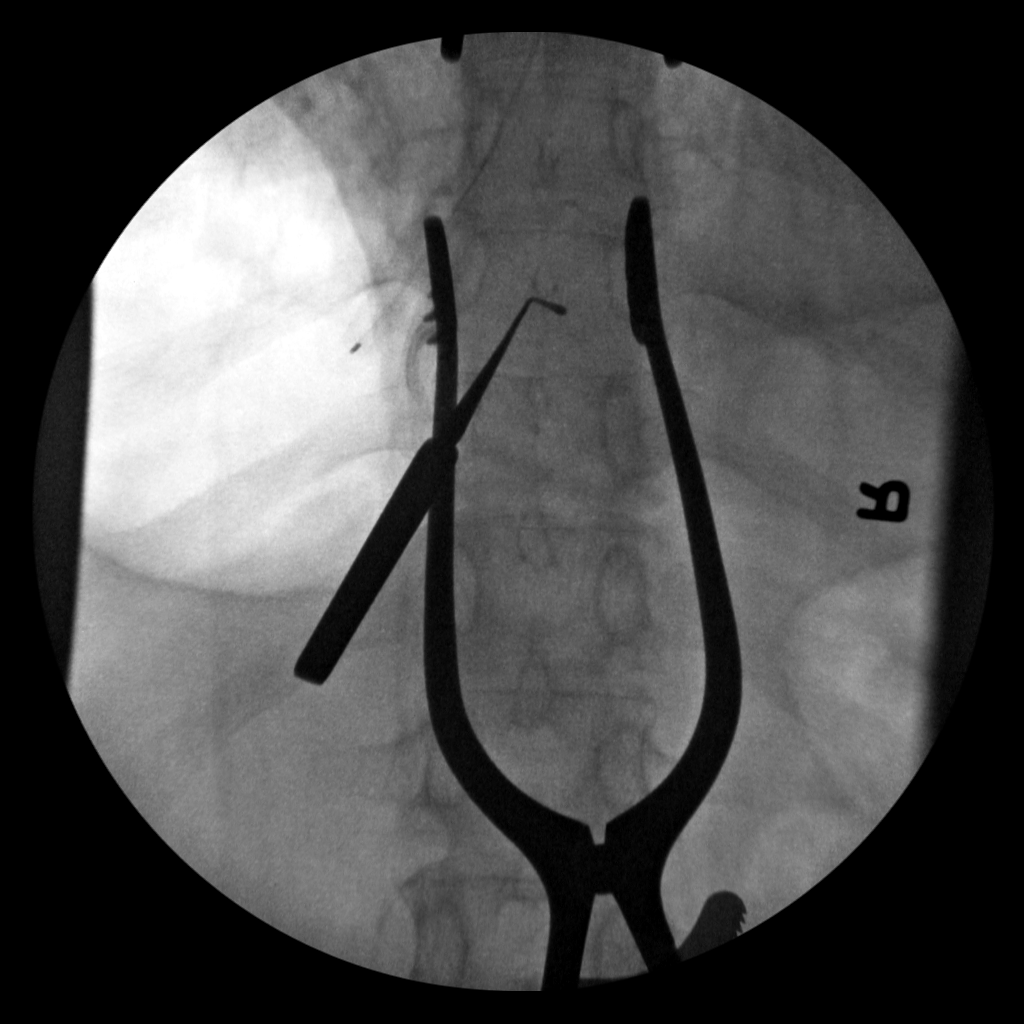

[2 of 2 positions shown; findings below may reference images not displayed]

FINDINGS: Two intraoperative spot images demonstrate soft tissue spreaders
posteriorly with a metallic instrument overlying the lamina at T10.
IMPRESSION: Intraoperative radiographs obtained during laminectomy for drainage
of an arachnoid cyst.

## 2022-01-25 ENCOUNTER — Other Ambulatory Visit: Payer: Self-pay | Admitting: Family Medicine

## 2022-01-25 DIAGNOSIS — E785 Hyperlipidemia, unspecified: Secondary | ICD-10-CM

## 2022-02-11 ENCOUNTER — Other Ambulatory Visit: Payer: Self-pay | Admitting: Family Medicine

## 2022-02-11 DIAGNOSIS — M546 Pain in thoracic spine: Secondary | ICD-10-CM

## 2022-03-09 ENCOUNTER — Other Ambulatory Visit (HOSPITAL_COMMUNITY): Payer: Self-pay | Admitting: Family Medicine

## 2022-03-09 DIAGNOSIS — Z139 Encounter for screening, unspecified: Secondary | ICD-10-CM

## 2022-03-10 ENCOUNTER — Ambulatory Visit (HOSPITAL_COMMUNITY)
Admission: RE | Admit: 2022-03-10 | Discharge: 2022-03-10 | Disposition: A | Discharge status: Home / Self Care | Payer: 59 | Source: Ambulatory Visit | Attending: Family Medicine | Admitting: Family Medicine

## 2022-03-10 ENCOUNTER — Ambulatory Visit
Admission: RE | Admit: 2022-03-10 | Discharge: 2022-03-10 | Disposition: A | Discharge status: Home / Self Care | Payer: No Typology Code available for payment source | Source: Ambulatory Visit | Attending: Family Medicine | Admitting: Family Medicine

## 2022-03-10 DIAGNOSIS — E785 Hyperlipidemia, unspecified: Secondary | ICD-10-CM

## 2022-03-10 DIAGNOSIS — Z139 Encounter for screening, unspecified: Secondary | ICD-10-CM | POA: Diagnosis not present

## 2022-03-15 ENCOUNTER — Ambulatory Visit
Admission: RE | Admit: 2022-03-15 | Discharge: 2022-03-15 | Disposition: A | Discharge status: Home / Self Care | Payer: Self-pay | Source: Ambulatory Visit | Attending: Family Medicine | Admitting: Family Medicine

## 2022-03-15 DIAGNOSIS — M546 Pain in thoracic spine: Secondary | ICD-10-CM

## 2022-04-09 ENCOUNTER — Ambulatory Visit: Payer: No Typology Code available for payment source

## 2022-04-23 ENCOUNTER — Ambulatory Visit: Payer: Self-pay | Admitting: Cardiology

## 2022-04-23 ENCOUNTER — Encounter: Payer: Self-pay | Admitting: Cardiology

## 2022-04-23 ENCOUNTER — Ambulatory Visit: Payer: 59 | Admitting: Cardiology

## 2022-04-23 VITALS — BP 171/95 | HR 77 | Ht 62.0 in | Wt 183.0 lb

## 2022-04-23 DIAGNOSIS — I2510 Atherosclerotic heart disease of native coronary artery without angina pectoris: Secondary | ICD-10-CM

## 2022-04-23 DIAGNOSIS — R072 Precordial pain: Secondary | ICD-10-CM

## 2022-04-23 DIAGNOSIS — R030 Elevated blood-pressure reading, without diagnosis of hypertension: Secondary | ICD-10-CM

## 2022-04-23 DIAGNOSIS — Z8249 Family history of ischemic heart disease and other diseases of the circulatory system: Secondary | ICD-10-CM

## 2022-04-23 DIAGNOSIS — E782 Mixed hyperlipidemia: Secondary | ICD-10-CM

## 2022-04-23 MED ORDER — ROSUVASTATIN CALCIUM 10 MG PO TABS: 10.0000 mg | ORAL_TABLET | Freq: Every evening | ORAL | 0 refills | Status: DC

## 2022-04-23 MED ORDER — ASPIRIN 81 MG PO TBEC: 81.0000 mg | DELAYED_RELEASE_TABLET | Freq: Every day | ORAL | 12 refills | Status: AC

## 2022-04-23 NOTE — Progress Notes (Signed)
ID:  Erica Moss, DOB October 18, 1960, MRN [id number]  PCP:  Aura Dials, MD  Cardiologist:  Rex Kras, DO, [facility] (established care 04/23/2022) Former Cardiology Providers: Dr. Percival Spanish  REASON FOR CONSULT: Chest Pain and Coronary artery calcification  REQUESTING PHYSICIAN:  Aura Dials, MD [address],  [place] [address]  Chief Complaint  Patient presents with   Chest Pain    HPI  Erica Moss is a 62 y.o. Caucasian female who presents to the clinic for evaluation of chest pain and Coronary artery calcification at the request of Aura Dials, MD. Her past medical history and cardiovascular risk factors include: Family history of premature CAD (dad had his first MI at the age of 73 -confounding factors smoker/diabetic), hyperlipidemia, subclinical hypothyroidism mild coronary artery calcification (67.4, 88th percentile as of January 2024), mild aortic atherosclerosis, former smoker, obesity, postmenopausal female.   Patient was referred to the practice for evaluation of chest pain and coronary calcification.  She has been experiencing chest pain similar to 2020 when she was noted to have a cyst in the back.  The discomfort originates from the posterior chest wall and travels anteriorly, not brought on by effort related activities, does not resolve with rest, self-limited.  The pain is more exacerbated with overhead activities.  Given her history she had an MRI with her PCP as well as a coronary calcium score for further cardiovascular restratification.  She also has underlying heartburn and after modifying her activities the precordial discomfort and heartburn has improved.  But coronary calcium score was noted to be mild with a total CAC of 67.4 placing her at the 88th percentile.  She currently does not have any anginal chest pain or heart failure symptoms.  Office blood pressures are not well-controlled.  She does not carry history of hypertension.  States  that her home blood pressures are very well-controlled with SBP ranging between 117-130 mmHg and DBP ranging between 77-85 mmHg.  Family history of premature CAD-father had an MI at the age of 59 but he was also smoker/diabetic.  Patient states that she has had a stroke in her 35s.  FUNCTIONAL STATUS: Tries to walk 0.5 mile per day - during lunch break.   ALLERGIES: No Known Allergies  MEDICATION LIST PRIOR TO VISIT: Current Meds  Medication Sig   aspirin EC 81 MG tablet Take 1 tablet (81 mg total) by mouth daily. Swallow whole.   famotidine (PEPCID) 20 MG tablet Take 20 mg by mouth daily as needed for heartburn or indigestion.   rosuvastatin (CRESTOR) 10 MG tablet Take 1 tablet (10 mg total) by mouth at bedtime.     PAST MEDICAL HISTORY: Past Medical History:  Diagnosis Date   Back pain    Body mass index (bmi) 33.0-33.9, adult    Cyst of thoracic facet joint    GERD (gastroesophageal reflux disease)    Mitral valve prolapse    Spondylolisthesis at L5-S1 level    Stroke [facility])     PAST SURGICAL HISTORY: Past Surgical History:  Procedure Laterality Date   COLONOSCOPY     LAMINECTOMY N/A 03/29/2018   Procedure: Thoracic nine to thoracic twelve Laminectomy for drainage of arachnoid cyst;  Surgeon: Newman Pies, MD;  Location: [facility];  Service: Neurosurgery;  Laterality: N/A;   LAPAROSCOPY     for endometriosis   VAGINAL HYSTERECTOMY     WISDOM TOOTH EXTRACTION      FAMILY HISTORY: The patient family history includes Diabetes in  her father and sister; Diabetic kidney disease in her sister; Heart attack (age of onset: 55) in her father; Heart disease in her father; Heart failure in her sister; Kidney disease in her mother; Stroke in her sister.  SOCIAL HISTORY:  The patient  reports that she has quit smoking. She has never used smokeless tobacco. She reports current alcohol use of about 1.0 standard drink of alcohol per week. She reports that she does not use  drugs.  REVIEW OF SYSTEMS: Review of Systems  Cardiovascular:  Positive for chest pain (see HPI). Negative for claudication, dyspnea on exertion, irregular heartbeat, leg swelling, near-syncope, orthopnea, palpitations, paroxysmal nocturnal dyspnea and syncope.  Respiratory:  Negative for shortness of breath.   Hematologic/Lymphatic: Negative for bleeding problem.  Musculoskeletal:  Negative for muscle cramps and myalgias.  Neurological:  Negative for dizziness and light-headedness.    PHYSICAL EXAM:    04/23/2022    2:06 PM 03/30/2018    7:27 AM 03/30/2018    3:54 AM  Vitals with BMI  Height 5' 2"$     Weight 183 lbs    BMI [id number]    Systolic [id number]  Diastolic 95 73 79  Pulse 77 72 74    Physical Exam  Constitutional: No distress.  Age appropriate, hemodynamically stable.   Neck: No JVD present.  Cardiovascular: Normal rate, regular rhythm, S1 normal, S2 normal, intact distal pulses and normal pulses. Exam reveals no gallop, no S3 and no S4.  No murmur heard. Pulses:      Dorsalis pedis pulses are 2+ on the right side and 2+ on the left side.       Posterior tibial pulses are 2+ on the right side and 2+ on the left side.  Pulmonary/Chest: Effort normal and breath sounds normal. No stridor. She has no wheezes. She has no rales.  Abdominal: Soft. Bowel sounds are normal. She exhibits no distension. There is no abdominal tenderness.  Musculoskeletal:        General: No edema.     Cervical back: Neck supple.  Neurological: She is alert and oriented to person, place, and time. She has intact cranial nerves (2-12).  Skin: Skin is warm and moist.   CARDIAC DATABASE: EKG: 04/23/2022: Sinus rhythm, 71 bpm, LAE, without underlying ischemia or injury pattern.  Echocardiogram: No results found for this or any previous visit from the past 1095 days.    Stress Testing: GXT 12/13/20219 There was no ST segment deviation noted during stress. The patient walked for a total of 6  minutes on a standard Bruce protocol treadmill test. She achieved a peak heart rate of 173 which is 104% predicted maximal heart rate. At peak exercise there were no ST or T wave changes. Pressure response to exercise was normal. This is interpreted as a negative exercise test. She has fair exercise capacity. There is no evidence of ischemia and no QRS widening at peak exercise.   Heart Catheterization: None  LABORATORY DATA: External Labs: Collected: 12/18/2021 provided by PCP. Total cholesterol 211, triglycerides 59, HDL 67, calculated LDL 133, non-HDL 144. TSH 3.09. BUN 18, creatinine 0.85. Sodium 141, potassium 4.2, chloride 103, bicarb 30. AST 18, ALT 14, alkaline phosphatase 93. Hemoglobin 12.8, hematocrit 38.8%  IMPRESSION:    ICD-10-CM   1. Precordial pain  R07.2 EKG 12-Lead    PCV MYOCARDIAL PERFUSION WO LEXISCAN    PCV ECHOCARDIOGRAM COMPLETE    2. Coronary atherosclerosis due to calcified coronary lesion  I25.10 PCV MYOCARDIAL PERFUSION WO  LEXISCAN   I25.84 PCV ECHOCARDIOGRAM COMPLETE    aspirin EC 81 MG tablet    rosuvastatin (CRESTOR) 10 MG tablet    Lipid Panel With LDL/HDL Ratio    LDL cholesterol, direct    CMP14+EGFR    3. Family history of premature CAD  Z82.49     4. Mixed hyperlipidemia  E78.2     5. Blood pressure elevated without history of HTN  R03.0        RECOMMENDATIONS: JASHAUNA PELLER is a 62 y.o. Caucasian female whose past medical history and cardiac risk factors include: Family history of premature CAD (dad had his first MI at the age of 65 -confounding factors smoker/diabetic), hyperlipidemia, subclinical hypothyroidism mild coronary artery calcification (67.4, 88th percentile as of January 2024), mild aortic atherosclerosis, former smoker, obesity, postmenopausal female.  Precordial pain Coronary atherosclerosis due to calcified coronary lesion Family history of premature CAD Precordial discomfort appears to be noncardiac. EKG: Sinus  without underlying ischemia or injury pattern. Total CAC 67.4, 88th percentile Multiple other cardiovascular risk factors. Echo will be ordered to evaluate for structural heart disease and left ventricular systolic function. Exercise nuclear stress test to evaluate functional capacity and reversible ischemia. Start aspirin 81 mg p.o. daily and statin therapy in the setting of CAC and prior history of stroke.  Mixed hyperlipidemia Start rosuvastatin 10 mg p.o. nightly. Indication: CAC and history of stroke She denies myalgia or other side effects. Most recent lipids dated October 2023, independently reviewed as noted above. Currently managed by primary care provider. Will recheck fasting lipid profile and CMP in 6 weeks to reevaluate therapy  Blood pressure elevated without history of HTN Office blood pressures are uncontrolled. Repeat blood pressure check in both arms resulted in SBP of 180 mmHg. Patient states that her home blood pressures are very well-controlled. I have asked her to keep a log of her blood pressures and to review the readings with either myself or her PCP. Have also asked her to bring her blood pressure cuff and either my office or her PCP to see if they correlate.  Data Reviewed: I have independently reviewed external notes provided by the referring provider as part of this office visit.   I have independently reviewed results of EKG, labs, prior GXT results, prior cardiology consultation note as part of medical decision making. I have ordered the following tests:  Orders Placed This Encounter  Procedures   Lipid Panel With LDL/HDL Ratio    Standing Status:   Future    Standing Expiration Date:   04/24/2023   LDL cholesterol, direct    Standing Status:   Future    Standing Expiration Date:   04/24/2023   CMP14+EGFR    Standing Status:   Future    Standing Expiration Date:   04/24/2023   PCV MYOCARDIAL PERFUSION WO LEXISCAN    Standing Status:   Future     Standing Expiration Date:   04/24/2023   EKG 12-Lead   PCV ECHOCARDIOGRAM COMPLETE    Standing Status:   Future    Standing Expiration Date:   04/24/2023  I have made medications changes at today's encounter as noted above. History of present illness was obtained by both patient and husband at today's office visit.    FINAL MEDICATION LIST END OF ENCOUNTER: Meds ordered this encounter  Medications   aspirin EC 81 MG tablet    Sig: Take 1 tablet (81 mg total) by mouth daily. Swallow whole.    Dispense:  30 tablet    Refill:  12   rosuvastatin (CRESTOR) 10 MG tablet    Sig: Take 1 tablet (10 mg total) by mouth at bedtime.    Dispense:  90 tablet    Refill:  0    Medications Discontinued During This Encounter  Medication Reason   cyclobenzaprine (FLEXERIL) 10 MG tablet Patient Preference   docusate sodium (COLACE) 100 MG capsule Patient Preference   oxyCODONE (OXY IR/ROXICODONE) 5 MG immediate release tablet Patient Preference     Current Outpatient Medications:    aspirin EC 81 MG tablet, Take 1 tablet (81 mg total) by mouth daily. Swallow whole., Disp: 30 tablet, Rfl: 12   famotidine (PEPCID) 20 MG tablet, Take 20 mg by mouth daily as needed for heartburn or indigestion., Disp: , Rfl:    rosuvastatin (CRESTOR) 10 MG tablet, Take 1 tablet (10 mg total) by mouth at bedtime., Disp: 90 tablet, Rfl: 0  Orders Placed This Encounter  Procedures   Lipid Panel With LDL/HDL Ratio   LDL cholesterol, direct   CMP14+EGFR   PCV MYOCARDIAL PERFUSION WO LEXISCAN   EKG 12-Lead   PCV ECHOCARDIOGRAM COMPLETE    There are no Patient Instructions on file for this visit.   --Continue cardiac medications as reconciled in final medication list. --Return in about 7 weeks (around 06/11/2022) for Follow up, Chest pain, Review test results. or sooner if needed. --Continue follow-up with your primary care physician regarding the management of your other chronic comorbid conditions.  Patient's  questions and concerns were addressed to her satisfaction. She voices understanding of the instructions provided during this encounter.   This note was created using a voice recognition software as a result there may be grammatical errors inadvertently enclosed that do not reflect the nature of this encounter. Every attempt is made to correct such errors.  Rex Kras, [place], [facility]  Pager: [phone] Office: [phone]

## 2022-05-11 ENCOUNTER — Other Ambulatory Visit: Payer: 59

## 2022-06-03 ENCOUNTER — Ambulatory Visit: Payer: 59

## 2022-06-03 DIAGNOSIS — I2510 Atherosclerotic heart disease of native coronary artery without angina pectoris: Secondary | ICD-10-CM

## 2022-06-03 DIAGNOSIS — R072 Precordial pain: Secondary | ICD-10-CM

## 2022-06-06 LAB — PCV MYOCARDIAL PERFUSION WO LEXISCAN: ST Depression (mm): 0 mm

## 2022-06-10 NOTE — Progress Notes (Signed)
Gave patient results, she acknowledged understanding and had no futher questions.

## 2022-06-10 NOTE — Progress Notes (Signed)
Let her know they would be reviewed at 06/11/22 appt.

## 2022-06-11 ENCOUNTER — Ambulatory Visit: Payer: 59 | Admitting: Cardiology

## 2022-06-11 ENCOUNTER — Encounter: Payer: Self-pay | Admitting: Cardiology

## 2022-06-11 VITALS — BP 168/93 | HR 82 | Resp 16 | Ht 62.0 in | Wt 184.0 lb

## 2022-06-11 DIAGNOSIS — R072 Precordial pain: Secondary | ICD-10-CM

## 2022-06-11 DIAGNOSIS — E782 Mixed hyperlipidemia: Secondary | ICD-10-CM

## 2022-06-11 DIAGNOSIS — I10 Essential (primary) hypertension: Secondary | ICD-10-CM

## 2022-06-11 DIAGNOSIS — Z8249 Family history of ischemic heart disease and other diseases of the circulatory system: Secondary | ICD-10-CM

## 2022-06-11 DIAGNOSIS — I2510 Atherosclerotic heart disease of native coronary artery without angina pectoris: Secondary | ICD-10-CM

## 2022-06-11 DIAGNOSIS — R0683 Snoring: Secondary | ICD-10-CM

## 2022-06-11 MED ORDER — HYDROCHLOROTHIAZIDE 25 MG PO TABS: 25.0000 mg | ORAL_TABLET | Freq: Every morning | ORAL | 0 refills | Status: DC

## 2022-06-11 NOTE — Progress Notes (Signed)
ID:  KYNDAHL JABLON, DOB 23-Aug-1960, MRN [id number]  PCP:  Henrine Screws, MD  Cardiologist:  Tessa Lerner, DO, [facility] (established care 04/23/2022) Former Cardiology Providers: Dr. Antoine Poche  Date: 06/11/22 Last Office Visit: 04/23/2022  Chief Complaint  Patient presents with   Chest Pain   Follow-up    7 weeks    HPI  Erica Moss is a 62 y.o. Caucasian female whose past medical history and cardiovascular risk factors include: Family history of premature CAD (dad had his first MI at the age of 22 -confounding factors smoker/diabetic), hyperlipidemia, subclinical hypothyroidism mild coronary artery calcification (67.4, 88th percentile as of January 2024), mild aortic atherosclerosis, former smoker, obesity, postmenopausal female.  Patient was referred to the practice for evaluation of chest pain and coronary artery calcification.  His last office visit she was recommended to undergo echo and stress test for further evaluation of structural heart disease and reversible ischemia.  Results reviewed with her and her husband at today's office visit.  Clinically she denies any chest pain since last office visit.  She was started on rosuvastatin 10 mg p.o. nightly at last office visit given her history of stroke and CAC.  She had repeat lipids with PCP which were independently reviewed and noted below there is significant improvement to her LDL levels.  Office blood pressures remain uncontrolled.  Home blood pressures are better controlled according to the patient (she forgot her BP log at home).  Of note she also had hypertensive response to exercise on her most recent stress test.  Family history of premature CAD-father had an MI at the age of 56 but he was also smoker/diabetic.  Patient states that she has had a stroke in her 30s.   FUNCTIONAL STATUS: Tries to walk 0.5 mile per day - during lunch break.   ALLERGIES: No Known Allergies  MEDICATION LIST PRIOR TO  VISIT: Current Meds  Medication Sig   aspirin EC 81 MG tablet Take 1 tablet (81 mg total) by mouth daily. Swallow whole.   famotidine (PEPCID) 20 MG tablet Take 20 mg by mouth daily as needed for heartburn or indigestion.   hydrochlorothiazide (HYDRODIURIL) 25 MG tablet Take 1 tablet (25 mg total) by mouth every morning.   rosuvastatin (CRESTOR) 10 MG tablet Take 1 tablet (10 mg total) by mouth at bedtime.     PAST MEDICAL HISTORY: Past Medical History:  Diagnosis Date   Back pain    Body mass index (bmi) 33.0-33.9, adult    Cyst of thoracic facet joint    GERD (gastroesophageal reflux disease)    Mitral valve prolapse    Spondylolisthesis at L5-S1 level    Stroke     PAST SURGICAL HISTORY: Past Surgical History:  Procedure Laterality Date   COLONOSCOPY     LAMINECTOMY N/A 03/29/2018   Procedure: Thoracic nine to thoracic twelve Laminectomy for drainage of arachnoid cyst;  Surgeon: Tressie Stalker, MD;  Location: [facility] OR;  Service: Neurosurgery;  Laterality: N/A;   LAPAROSCOPY     for endometriosis   VAGINAL HYSTERECTOMY     WISDOM TOOTH EXTRACTION      FAMILY HISTORY: The patient family history includes Diabetes in her father and sister; Diabetic kidney disease in her sister; Heart attack (age of onset: 15) in her father; Heart disease in her father; Heart failure in her sister; Kidney disease in her mother; Stroke in her sister.  SOCIAL HISTORY:  The patient  reports that she has quit smoking. She has never  used smokeless tobacco. She reports that she does not drink alcohol and does not use drugs.  REVIEW OF SYSTEMS: Review of Systems  Cardiovascular:  Negative for chest pain, claudication, dyspnea on exertion, irregular heartbeat, leg swelling, near-syncope, orthopnea, palpitations, paroxysmal nocturnal dyspnea and syncope.  Respiratory:  Positive for snoring. Negative for shortness of breath.   Hematologic/Lymphatic: Negative for bleeding problem.  Musculoskeletal:   Negative for muscle cramps and myalgias.  Neurological:  Negative for dizziness and light-headedness.    PHYSICAL EXAM:    06/11/2022    1:34 PM 04/23/2022    2:06 PM 03/30/2018    7:27 AM  Vitals with BMI  Height 5\' 2"  5\' 2"    Weight 184 lbs 183 lbs   BMI 33.65 33.46   Systolic 168 171 [id number]  Diastolic 93 95 73  Pulse 82 77 72    Physical Exam  Constitutional: No distress.  Age appropriate, hemodynamically stable.   Neck: No JVD present.  Cardiovascular: Normal rate, regular rhythm, S1 normal, S2 normal, intact distal pulses and normal pulses. Exam reveals no gallop, no S3 and no S4.  No murmur heard. Pulses:      Dorsalis pedis pulses are 2+ on the right side and 2+ on the left side.       Posterior tibial pulses are 2+ on the right side and 2+ on the left side.  Pulmonary/Chest: Effort normal and breath sounds normal. No stridor. She has no wheezes. She has no rales.  Abdominal: Soft. Bowel sounds are normal. She exhibits no distension. There is no abdominal tenderness.  Musculoskeletal:        General: No edema.     Cervical back: Neck supple.  Neurological: She is alert and oriented to person, place, and time. She has intact cranial nerves (2-12).  Skin: Skin is warm and moist.   CARDIAC DATABASE: EKG: 04/23/2022: Sinus rhythm, 71 bpm, LAE, without underlying ischemia or injury pattern.  Echocardiogram: 06/03/2022:  Normal LV systolic function with visual EF 55-60%. Left ventricle cavity is normal in size. Normal left ventricular wall thickness. Normal global wall motion. Normal diastolic filling pattern, normal LAP. Calculated EF 56%. Structurally normal trileaflet aortic valve. Trace aortic regurgitation. Structurally normal mitral valve. Mild (Grade I) mitral regurgitation. Structurally normal tricuspid valve. Mild tricuspid regurgitation. No evidence of pulmonary hypertension. No prior available for comparison.     Stress Testing: Exercise nuclear stress test  06/03/2022: There is a very small reversible mild defect in the apical region.  Overall LV systolic function is normal without regional wall motion abnormalities. Stress LV EF: 67%.  Normal ECG stress. The patient exercised for 3 minutes and 0 seconds of a Bruce protocol, achieving approximately 5.24 METs. Baseline heart rate was 83 bpm. A maximum heart rate of 146 beats per minute was achieved, which is 91% MPHR. No chest pain.  No previous exam available for comparison. C/O fatigue and THR achieved. The heart rate response was normal.  The baseline blood pressure was 130/76 mmHg and increased to 200/100 mmHg, hypertensive response.  Low risk.   Heart Catheterization: None  LABORATORY DATA: External Labs: Collected: 12/18/2021 provided by PCP. Total cholesterol 211, triglycerides 59, HDL 67, calculated LDL 133, non-HDL 144. TSH 3.09. BUN 18, creatinine 0.85. Sodium 141, potassium 4.2, chloride 103, bicarb 30. AST 18, ALT 14, alkaline phosphatase 93. Hemoglobin 12.8, hematocrit 38.8%  Lipid Panel w/reflex Reviewed date:06/09/2022 07:56:13 AM Interpretation: Performing Lab: Notes/Report: Testing Performed at: [facility], [address], [address],  [place], [place] [address]  Cholesterol 149 <200 mg/dL    CHOL/HDL 2.0 4.2-5.92.0-4.0 Ratio    HDLD 73 30-85 mg/dL Values below 40 mg/dL indicate increased risk factor  Triglyceride 57 0-199 mg/dL    NHDL 75 [id number] mg/dL Range dependent upon risk factors.  LDL Chol Calc (NIH) 63 0-99 mg/dL    Hepatic Panel Reviewed date:06/09/2022 07:56:42 AM Interpretation: Performing Lab: Notes/Report: Testing Performed at: [facility], [address], [address], [place], [place] [address]  Protein, Total 6.4 6.0-8.3 g/dL    Albumin 4.1 3.2-9.53.4-4.8 g/dL    ALP 83 [id number] U/L    AST 19 0-39 U/L    ALT 17 0-52 U/L    TBIL 0.5 0.3-1.0 mg/dL    DBIL 0.1 6.6-0.60.0-0.2 mg/dL    IBIL 0.4 3.0-1.60.3-0.7 mg/dL      IMPRESSION:    WFU-93-ATICD-10-CM   1. Precordial pain  R07.2      2. Coronary atherosclerosis due to calcified coronary lesion  I25.10    I25.84     3. Benign hypertension  I10 Ambulatory referral to Sleep Studies    hydrochlorothiazide (HYDRODIURIL) 25 MG tablet    Basic metabolic panel    4. Snoring  R06.83 Ambulatory referral to Sleep Studies    5. Family history of premature CAD  Z82.49     6. Mixed hyperlipidemia  E78.2        RECOMMENDATIONS: Erica Moss is a 62 y.o. Caucasian female whose past medical history and cardiac risk factors include: Family history of premature CAD (dad had his first MI at the age of 60 -confounding factors smoker/diabetic), hyperlipidemia, subclinical hypothyroidism mild coronary artery calcification (67.4, 88th percentile as of January 2024), mild aortic atherosclerosis, former smoker, obesity, postmenopausal female.  Precordial pain Coronary atherosclerosis due to calcified coronary lesion Family history of premature CAD Precordial discomfort has resolved since last office visit Prior EKG: Sinus without underlying ischemia or injury pattern. Total CAC 67.4, 88th percentile. As tolerated aspirin and statin therapy well Repeat lipids noted significant improvement in LDL levels.  Outside labs independently reviewed Echo and stress test results reviewed. Of note, MPI notes small size of very subtle reversible defect in the apical region.  Since she is asymptomatic no additional testing warranted at this time. Multiple other cardiovascular risk factors. Echo will be ordered to evaluate for structural heart disease and left ventricular systolic function. Exercise nuclear stress test to evaluate functional capacity and reversible ischemia. Start aspirin 81 mg p.o. daily and statin therapy in the setting of CAC and prior history of stroke.  Mixed hyperlipidemia Continue rosuvastatin 10 mg p.o. nightly. Indication: CAC and history of stroke She denies myalgia or other side effects. LDL levels have improved  from 133 mg/dL to 63 mg/dL. Outside labs independently reviewed.    Benign essential hypertension  Start hydrochlorothiazide 25 mg p.o. every morning. Labs in 1 week to evaluate kidney function.   Reemphasized importance of low-salt diet.  There is clinical suspicion for undiagnosed sleep apnea and therefore will refer to sleep medicine for further evaluation and management  FINAL MEDICATION LIST END OF ENCOUNTER: Meds ordered this encounter  Medications   hydrochlorothiazide (HYDRODIURIL) 25 MG tablet    Sig: Take 1 tablet (25 mg total) by mouth every morning.    Dispense:  30 tablet    Refill:  0    There are no discontinued medications.    Current Outpatient Medications:    aspirin EC 81 MG tablet, Take 1 tablet (81 mg  total) by mouth daily. Swallow whole., Disp: 30 tablet, Rfl: 12   famotidine (PEPCID) 20 MG tablet, Take 20 mg by mouth daily as needed for heartburn or indigestion., Disp: , Rfl:    hydrochlorothiazide (HYDRODIURIL) 25 MG tablet, Take 1 tablet (25 mg total) by mouth every morning., Disp: 30 tablet, Rfl: 0   rosuvastatin (CRESTOR) 10 MG tablet, Take 1 tablet (10 mg total) by mouth at bedtime., Disp: 90 tablet, Rfl: 0  Orders Placed This Encounter  Procedures   Basic metabolic panel   Ambulatory referral to Sleep Studies    There are no Patient Instructions on file for this visit.   --Continue cardiac medications as reconciled in final medication list. --Return in about 6 months (around 12/11/2022) for Follow up, Coronary artery calcification. or sooner if needed. --Continue follow-up with your primary care physician regarding the management of your other chronic comorbid conditions.  Patient's questions and concerns were addressed to her satisfaction. She voices understanding of the instructions provided during this encounter.   This note was created using a voice recognition software as a result there may be grammatical errors inadvertently enclosed that do  not reflect the nature of this encounter. Every attempt is made to correct such errors.  Tessa Lerner, [place], [facility]  Pager:  [phone] Office: [phone]

## 2022-06-23 ENCOUNTER — Ambulatory Visit (INDEPENDENT_AMBULATORY_CARE_PROVIDER_SITE_OTHER): Payer: 59 | Admitting: Orthopaedic Surgery

## 2022-06-23 ENCOUNTER — Other Ambulatory Visit: Payer: Self-pay

## 2022-06-23 ENCOUNTER — Encounter: Payer: Self-pay | Admitting: Orthopaedic Surgery

## 2022-06-23 VITALS — Ht 62.0 in | Wt 182.0 lb

## 2022-06-23 DIAGNOSIS — G8929 Other chronic pain: Secondary | ICD-10-CM

## 2022-06-23 DIAGNOSIS — M25511 Pain in right shoulder: Secondary | ICD-10-CM | POA: Diagnosis not present

## 2022-06-23 DIAGNOSIS — M25562 Pain in left knee: Secondary | ICD-10-CM

## 2022-06-23 DIAGNOSIS — M7541 Impingement syndrome of right shoulder: Secondary | ICD-10-CM | POA: Diagnosis not present

## 2022-06-23 NOTE — Progress Notes (Unsigned)
2  Office Visit Note   Patient: Erica Moss           Date of Birth: [birth year]           MRN: [id number] Visit Date: 06/23/2022              Requested by: Henrine Screws, MD [address] [place],  [place] [address] PCP: Henrine Screws, MD   Assessment & Plan: Visit Diagnoses:  1. Chronic right shoulder pain   2. Chronic pain of left knee     Plan: She can continue some short-term anti-inflammatories for her knee and if she starts to develop locking or significant swelling she can return.  Subacromial injection performed right shoulder which she tolerated well with improvement in symptoms.  Follow-Up Instructions: Return in about 4 weeks (around 07/21/2022).   Orders:  Orders Placed This Encounter  Procedures   XR KNEE 3 VIEW LEFT   XR Shoulder Right   No orders of the defined types were placed in this encounter.     Procedures: Large Joint Inj: R subacromial bursa on 06/24/2022 12:54 PM Indications: pain Details: 22 G 1.5 in needle, lateral approach  Arthrogram: No  Medications: 4 mL bupivacaine 0.25 %; 40 mg methylPREDNISolone acetate 40 MG/ML; 0.5 mL lidocaine 1 % Outcome: tolerated well, no immediate complications Procedure, treatment alternatives, risks and benefits explained, specific risks discussed. Consent was given by the patient. Immediately prior to procedure a time out was called to verify the correct patient, procedure, equipment, support staff and site/side marked as required. Patient was prepped and draped in the usual sterile fashion.       Clinical Data: No additional findings.   Subjective: Chief Complaint  Patient presents with   Right Shoulder - Pain   Left Knee - Pain    HPI 62 year old female new patient visit with left knee pain and right shoulder pain.  Left knee pain has been present for 3 weeks constant.  States she was getting up and had sharp pain in her knee with swelling in her knee primarily in the back and posterior  medial.  She did have a history of a fall on her knee about 1 year ago landing on both knees anteriorly but states the soreness resolved.  She is use lidocaine patches.  She is on her feet a lot states she has problems getting from sitting to standing and occasionally she has pop in her knee which is painful.  She has not had any true locking.  Other problems right shoulder pain chronic bothers her with outstretched and overhead activities painful to reach behind her.  Denies associated neck pain no numbness or tingling in the fingers.  Review of Systems14 pt noncontributory to HPI   Objective: Vital Signs: Ht  (1.575 m)   Wt 182 lb (82.6 kg)   BMI 33.29 kg/m   Physical Exam Constitutional:      Appearance: She is well-developed.  HENT:     Head: Normocephalic.     Right Ear: External ear normal.     Left Ear: External ear normal. There is no impacted cerumen.  Eyes:     Pupils: Pupils are equal, round, and reactive to light.  Neck:     Thyroid: No thyromegaly.     Trachea: No tracheal deviation.  Cardiovascular:     Rate and Rhythm: Normal rate.  Pulmonary:     Effort: Pulmonary effort is normal.  Abdominal:  Palpations: Abdomen is soft.  Musculoskeletal:     Cervical back: No rigidity.  Skin:    General: Skin is warm and dry.  Neurological:     Mental Status: She is alert and oriented to person, place, and time.  Psychiatric:        Behavior: Behavior normal.     Ortho Exam negative Spurling good range of motion negative drop arm test positive impingement test negative Hawkins negative Yergason negative Allen's negative lift off.  Reflexes are intact station hand is intact.  Left knee demonstrates some mild medial and lateral joint line tenderness.  Negative pivot shift negative Lachman anterior posterior drawer testing is normal negative logroll to the hips.  No sciatic notch tenderness.  Specialty Comments:  No specialty comments available.  Imaging: No  results found.   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   Past Medical History:  Diagnosis Date   Back pain    Body mass index (bmi) 33.0-33.9, adult    Cyst of thoracic facet joint    GERD (gastroesophageal reflux disease)    Mitral valve prolapse    Spondylolisthesis at L5-S1 level    Stroke     Family History  Problem Relation Age of Onset   Kidney disease Mother    Heart disease Father    Diabetes Father    Heart attack Father 16       Died of MI   Heart failure Sister    Stroke Sister    Diabetes Sister    Diabetic kidney disease Sister     Past Surgical History:  Procedure Laterality Date   COLONOSCOPY     LAMINECTOMY N/A 03/29/2018   Procedure: Thoracic nine to thoracic twelve Laminectomy for drainage of arachnoid cyst;  Surgeon: Tressie Stalker, MD;  Location: [facility] OR;  Service: Neurosurgery;  Laterality: N/A;   LAPAROSCOPY     for endometriosis   VAGINAL HYSTERECTOMY     WISDOM TOOTH EXTRACTION     Social History   Occupational History   Not on file  Tobacco Use   Smoking status: Former   Smokeless tobacco: Never   Tobacco comments:    quit 13 years ago  Vaping Use   Vaping Use: Never used  Substance and Sexual Activity   Alcohol use: Never    Comment: socially   Drug use: Never   Sexual activity: Not on file

## 2022-06-24 DIAGNOSIS — M7541 Impingement syndrome of right shoulder: Secondary | ICD-10-CM | POA: Insufficient documentation

## 2022-06-24 MED ORDER — METHYLPREDNISOLONE ACETATE 40 MG/ML IJ SUSP
40.0000 mg | INTRAMUSCULAR | Status: AC | PRN
  Administered 2022-06-24: 40 mg via INTRA_ARTICULAR

## 2022-06-24 MED ORDER — BUPIVACAINE HCL 0.25 % IJ SOLN
4.0000 mL | INTRAMUSCULAR | Status: AC | PRN
  Administered 2022-06-24: 4 mL via INTRA_ARTICULAR

## 2022-06-24 MED ORDER — LIDOCAINE HCL 1 % IJ SOLN
0.5000 mL | INTRAMUSCULAR | Status: AC | PRN
  Administered 2022-06-24: .5 mL

## 2022-07-19 ENCOUNTER — Other Ambulatory Visit: Payer: Self-pay | Admitting: Cardiology

## 2022-07-19 DIAGNOSIS — I10 Essential (primary) hypertension: Secondary | ICD-10-CM

## 2022-07-19 DIAGNOSIS — I2510 Atherosclerotic heart disease of native coronary artery without angina pectoris: Secondary | ICD-10-CM

## 2022-08-19 ENCOUNTER — Other Ambulatory Visit: Payer: Self-pay | Admitting: Cardiology

## 2022-08-19 DIAGNOSIS — I10 Essential (primary) hypertension: Secondary | ICD-10-CM

## 2022-10-19 ENCOUNTER — Other Ambulatory Visit: Payer: Self-pay | Admitting: Cardiology

## 2022-10-19 DIAGNOSIS — I2510 Atherosclerotic heart disease of native coronary artery without angina pectoris: Secondary | ICD-10-CM

## 2022-11-17 ENCOUNTER — Encounter: Payer: Self-pay | Admitting: Orthopaedic Surgery

## 2022-11-17 ENCOUNTER — Ambulatory Visit (INDEPENDENT_AMBULATORY_CARE_PROVIDER_SITE_OTHER): Payer: 59 | Admitting: Orthopaedic Surgery

## 2022-11-17 VITALS — Ht 62.0 in | Wt 180.0 lb

## 2022-11-17 DIAGNOSIS — M7541 Impingement syndrome of right shoulder: Secondary | ICD-10-CM

## 2022-11-17 NOTE — Progress Notes (Unsigned)
   Office Visit Note   Patient: Erica Moss           Date of Birth: 1961/02/09           MRN: [id number] Visit Date: 11/17/2022              Requested by: Henrine Screws, MD [address] [place],  [place] [address] PCP: Henrine Screws, MD   Assessment & Plan: Visit Diagnoses: No diagnosis found.  Plan: ***  Follow-Up Instructions: No follow-ups on file.   Orders:  No orders of the defined types were placed in this encounter.  No orders of the defined types were placed in this encounter.     Procedures: No procedures performed   Clinical Data: No additional findings.   Subjective: Chief Complaint  Patient presents with   Right Shoulder - Pain    Deceased ROM more than left. They hurt more at night.Last injection didn't last more than a couple of weeks. Would like to get a MRI.   Left Shoulder - Pain    HPI  Review of Systems   Objective: Vital Signs: Ht 5\' 2"  (1.575 m)   Wt 180 lb (81.6 kg)   BMI 32.92 kg/m   Physical Exam  Ortho Exam  Specialty Comments:  No specialty comments available.  Imaging: No results found.   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   Past Medical History:  Diagnosis Date   Back pain    Body mass index (bmi) 33.0-33.9, adult    Cyst of thoracic facet joint    GERD (gastroesophageal reflux disease)    Mitral valve prolapse    Spondylolisthesis at L5-S1 level    Stroke [facility])     Family History  Problem Relation Age of Onset   Kidney disease Mother    Heart disease Father    Diabetes Father    Heart attack Father 27       Died of MI   Heart failure Sister    Stroke Sister    Diabetes Sister    Diabetic kidney disease Sister     Past Surgical History:  Procedure Laterality Date   COLONOSCOPY     LAMINECTOMY N/A 03/29/2018   Procedure: Thoracic nine to thoracic twelve Laminectomy  for drainage of arachnoid cyst;  Surgeon: Tressie Stalker, MD;  Location: [facility] OR;  Service: Neurosurgery;  Laterality: N/A;   LAPAROSCOPY     for endometriosis   VAGINAL HYSTERECTOMY     WISDOM TOOTH EXTRACTION     Social History   Occupational History   Not on file  Tobacco Use   Smoking status: Former   Smokeless tobacco: Never   Tobacco comments:    quit 13 years ago  Vaping Use   Vaping status: Never Used  Substance and Sexual Activity   Alcohol use: Never    Comment: socially   Drug use: Never   Sexual activity: Not on file

## 2022-11-18 ENCOUNTER — Other Ambulatory Visit: Payer: Self-pay | Admitting: Orthopaedic Surgery

## 2022-11-18 DIAGNOSIS — M75100 Unspecified rotator cuff tear or rupture of unspecified shoulder, not specified as traumatic: Secondary | ICD-10-CM

## 2022-11-24 ENCOUNTER — Encounter: Payer: Self-pay | Admitting: Orthopaedic Surgery

## 2022-11-29 ENCOUNTER — Ambulatory Visit
Admission: RE | Admit: 2022-11-29 | Discharge: 2022-11-29 | Disposition: A | Discharge status: Home / Self Care | Payer: 59 | Source: Ambulatory Visit | Attending: Orthopaedic Surgery | Admitting: Orthopaedic Surgery

## 2022-11-29 DIAGNOSIS — M75100 Unspecified rotator cuff tear or rupture of unspecified shoulder, not specified as traumatic: Secondary | ICD-10-CM

## 2022-12-09 ENCOUNTER — Ambulatory Visit (INDEPENDENT_AMBULATORY_CARE_PROVIDER_SITE_OTHER): Payer: 59 | Admitting: Orthopaedic Surgery

## 2022-12-09 ENCOUNTER — Encounter: Payer: Self-pay | Admitting: Orthopaedic Surgery

## 2022-12-09 DIAGNOSIS — M7541 Impingement syndrome of right shoulder: Secondary | ICD-10-CM | POA: Diagnosis not present

## 2022-12-09 NOTE — Progress Notes (Signed)
Office Visit Note   Patient: Erica Moss           Date of Birth: 1960/09/02           MRN: [id number] Visit Date: 12/09/2022              Requested by: Henrine Screws, MD [address] [place],  [place] [address] PCP: Henrine Screws, MD   Assessment & Plan: Visit Diagnoses:  1. Impingement syndrome of right shoulder     Plan: Patient with supraspinatus tendinosis.  We discussed activity modification at work to avoid loading supraspinatus is much.  In the reaching to the side and left and we discussed turning facing the object and lifting with both hands.  MRI scan was reviewed gave her a copy of the report discussed pathophysiology.  She states she like to avoid surgery but she has to have it performed she would like to get it done this year.  I will plan to recheck her back in a month.  Follow-Up Instructions: No follow-ups on file.   Orders:  No orders of the defined types were placed in this encounter.  No orders of the defined types were placed in this encounter.     Procedures: No procedures performed   Clinical Data: No additional findings.   Subjective: Chief Complaint  Patient presents with   Right Shoulder - Follow-up, Pain    Here for MRI results.     HPI 62 year old female returns with ongoing problems with right shoulder pain.  She works at [employer].  She describes problems when she reaches to the side has to abduct or lift and internally rotate the arm reproducing impingement test.  Discomfort with overhead lifting although she can get her arm up overhead.  In her job she rotates around to difference sections.  Review of Systems all systems noncontributory.   Objective: Vital Signs: There were no vitals taken for this visit.  Physical Exam Constitutional:      Appearance: She is well-developed.  HENT:     Head: Normocephalic.     Right Ear: External ear normal.     Left Ear: External ear normal. There is no  impacted cerumen.  Eyes:     Pupils: Pupils are equal, round, and reactive to light.  Neck:     Thyroid: No thyromegaly.     Trachea: No tracheal deviation.  Cardiovascular:     Rate and Rhythm: Normal rate.  Pulmonary:     Effort: Pulmonary effort is normal.  Abdominal:     Palpations: Abdomen is soft.  Musculoskeletal:     Cervical back: No rigidity.  Skin:    General: Skin is warm and dry.  Neurological:     Mental Status: She is alert and oriented to person, place, and time.  Psychiatric:        Behavior: Behavior normal.     Ortho Exam positive impingement Long head of the biceps tendon is nontender.  Discomfort with resisted supraspinatus testing negative drop arm test.  Specialty Comments:  No specialty comments available.  Imaging: Narrative & Impression  CLINICAL DATA:  Right shoulder pain for 1 year   EXAM: MRI OF THE RIGHT SHOULDER WITHOUT CONTRAST   TECHNIQUE: Multiplanar, multisequence MR imaging of the shoulder was performed. No intravenous contrast was administered.   COMPARISON:  None Available.   FINDINGS: Rotator cuff: Moderate tendinosis of the supraspinatus tendon with a small insertional interstitial tear. Moderate tendinosis of  the infraspinatus tendon with a small interstitial tear at the anterior musculotendinous junction. Teres minor tendon is intact. Subscapularis tendon is intact.   Muscles: No muscle atrophy or edema. No intramuscular fluid collection or hematoma.   Biceps Long Head: Mild tendinosis of the intra-articular portion of the long head of the biceps tendon.   Acromioclavicular Joint: No significant arthropathy of the acromioclavicular joint. No subacromial/subdeltoid bursal fluid.   Glenohumeral Joint: No joint effusion. No chondral defect.   Labrum: Grossly intact, but evaluation is limited by lack of intraarticular fluid/contrast.   Bones: No fracture or dislocation. No aggressive osseous lesion. Mild subcortical  reactive marrow changes at the infraspinatus insertion.   Other: No fluid collection or hematoma.   IMPRESSION: 1. Moderate tendinosis of the supraspinatus tendon with a small insertional interstitial tear. 2. Moderate tendinosis of the infraspinatus tendon with a small interstitial tear at the anterior musculotendinous junction. 3. Mild tendinosis of the intra-articular portion of the long head of the biceps tendon.     Electronically Signed   By: Elige Ko M.D.   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   Past Medical History:  Diagnosis Date   Back pain    Body mass index (bmi) 33.0-33.9, adult    Cyst of thoracic facet joint    GERD (gastroesophageal reflux disease)    Mitral valve prolapse    Spondylolisthesis at L5-S1 level    Stroke [facility])     Family History  Problem Relation Age of Onset   Kidney disease Mother    Heart disease Father    Diabetes Father    Heart attack Father 57       Died of MI   Heart failure Sister    Stroke Sister    Diabetes Sister    Diabetic kidney disease Sister     Past Surgical History:  Procedure Laterality Date   COLONOSCOPY     LAMINECTOMY N/A 03/29/2018   Procedure: Thoracic nine to thoracic twelve Laminectomy for drainage of arachnoid cyst;  Surgeon: Tressie Stalker, MD;  Location: [facility];  Service: Neurosurgery;  Laterality: N/A;   LAPAROSCOPY     for endometriosis   VAGINAL HYSTERECTOMY     WISDOM TOOTH EXTRACTION     Social History   Occupational History   Not on file  Tobacco Use   Smoking status: Former   Smokeless tobacco: Never   Tobacco comments:    quit 13 years ago  Vaping Use   Vaping status: Never Used  Substance and Sexual Activity   Alcohol use: Never    Comment: socially   Drug use: Never   Sexual activity: Not on file

## 2022-12-17 ENCOUNTER — Ambulatory Visit: Payer: Self-pay | Admitting: Cardiology

## 2023-01-13 ENCOUNTER — Ambulatory Visit: Payer: 59 | Admitting: Orthopaedic Surgery

## 2023-01-13 ENCOUNTER — Encounter: Payer: Self-pay | Admitting: Orthopaedic Surgery

## 2023-01-13 VITALS — Ht 62.0 in | Wt 180.0 lb

## 2023-01-13 DIAGNOSIS — M7541 Impingement syndrome of right shoulder: Secondary | ICD-10-CM

## 2023-01-13 NOTE — Progress Notes (Signed)
Office Visit Note   Patient: Erica Moss           Date of Birth: 1960-05-30           MRN: [id number] Visit Date: 01/13/2023              Requested by: Henrine Screws, MD [id number] [place],  [place] [address] PCP: Henrine Screws, MD   Assessment & Plan: Visit Diagnoses:  1. Impingement syndrome of right shoulder     Plan: Patient without improvement by avoiding activities that tend to load her rotator cuff with improvement.  Recheck 6 weeks if she continues to improve she can cancel her visit.  He is happy with results of activity modification and improvement in her symptoms with less pain.  Follow-Up Instructions: Return in about 6 weeks (around 02/24/2023).   Orders:  No orders of the defined types were placed in this encounter.  No orders of the defined types were placed in this encounter.     Procedures: No procedures performed   Clinical Data: No additional findings.   Subjective: Chief Complaint  Patient presents with   Right Shoulder - Pain    HPI patient here for follow-up of impingement with some tendinopathy and small partial tear.  She states she is modified some more work activity and is relieved 60% better.  She continues to avoid outstretch overhead positioning which increases stress on rotator cuff.  No numbness or tingling in her fingers.  She still working full-time.  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  She states she is sleeping better.  Review of Systems updated unchanged   Objective: Vital Signs: Ht 5\' 2"  (1.575 m)   Wt 180 lb (81.6 kg)   BMI 32.92 kg/m   Physical Exam Constitutional:      Appearance: She is well-developed.  HENT:     Head: Normocephalic.     Right Ear: External ear normal.     Left Ear: External ear normal. There is no impacted cerumen.  Eyes:     Pupils: Pupils are equal, round, and reactive to light.  Neck:      Thyroid: No thyromegaly.     Trachea: No tracheal deviation.  Cardiovascular:     Rate and Rhythm: Normal rate.  Pulmonary:     Effort: Pulmonary effort is normal.  Abdominal:     Palpations: Abdomen is soft.  Musculoskeletal:     Cervical back: No rigidity.  Skin:    General: Skin is warm and dry.  Neurological:     Mental Status: She is alert and oriented to person, place, and time.  Psychiatric:        Behavior: Behavior normal.     Ortho Exam full shoulder flexion and abduction long of the biceps is normal.  Mild positive impingement.  Good cervical range of motion.  Specialty Comments:  No specialty comments available.  Imaging: No results found.   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   Past Medical History:  Diagnosis Date   Back pain    Body mass index (bmi) 33.0-33.9, adult    Cyst of thoracic facet joint    GERD (gastroesophageal reflux disease)    Mitral valve prolapse    Spondylolisthesis at L5-S1 level  Stroke [facility])     Family History  Problem Relation Age of Onset   Kidney disease Mother    Heart disease Father    Diabetes Father    Heart attack Father 27       Died of MI   Heart failure Sister    Stroke Sister    Diabetes Sister    Diabetic kidney disease Sister     Past Surgical History:  Procedure Laterality Date   COLONOSCOPY     LAMINECTOMY N/A 03/29/2018   Procedure: Thoracic nine to thoracic twelve Laminectomy for drainage of arachnoid cyst;  Surgeon: Tressie Stalker, MD;  Location: [facility] OR;  Service: Neurosurgery;  Laterality: N/A;   LAPAROSCOPY     for endometriosis   VAGINAL HYSTERECTOMY     WISDOM TOOTH EXTRACTION     Social History   Occupational History   Not on file  Tobacco Use   Smoking status: Former   Smokeless tobacco: Never   Tobacco comments:    quit 13 years ago  Vaping Use   Vaping  status: Never Used  Substance and Sexual Activity   Alcohol use: Never    Comment: socially   Drug use: Never   Sexual activity: Not on file

## 2023-01-21 ENCOUNTER — Other Ambulatory Visit: Payer: Self-pay | Admitting: Cardiology

## 2023-01-21 DIAGNOSIS — I2510 Atherosclerotic heart disease of native coronary artery without angina pectoris: Secondary | ICD-10-CM

## 2023-02-24 ENCOUNTER — Ambulatory Visit: Payer: 59 | Admitting: Orthopaedic Surgery

## 2023-02-25 ENCOUNTER — Encounter: Payer: Self-pay | Admitting: Cardiology

## 2023-02-25 ENCOUNTER — Ambulatory Visit: Payer: 59 | Attending: Cardiology | Admitting: Cardiology

## 2023-02-25 VITALS — BP 152/96 | HR 88 | Resp 16 | Ht 62.0 in | Wt 181.2 lb

## 2023-02-25 DIAGNOSIS — G4730 Sleep apnea, unspecified: Secondary | ICD-10-CM | POA: Diagnosis not present

## 2023-02-25 DIAGNOSIS — E782 Mixed hyperlipidemia: Secondary | ICD-10-CM | POA: Diagnosis not present

## 2023-02-25 DIAGNOSIS — I2584 Coronary atherosclerosis due to calcified coronary lesion: Secondary | ICD-10-CM

## 2023-02-25 DIAGNOSIS — I10 Essential (primary) hypertension: Secondary | ICD-10-CM

## 2023-02-25 DIAGNOSIS — I2510 Atherosclerotic heart disease of native coronary artery without angina pectoris: Secondary | ICD-10-CM

## 2023-02-25 NOTE — Patient Instructions (Signed)

## 2023-02-25 NOTE — Progress Notes (Signed)
Cardiology Office Note:  .   Date:  02/25/2023  ID:  Erica Moss, DOB [birth year], MRN [id number] PCP:  Henrine Screws, MD  Former Cardiology Providers: Dr. [facility] Providers Cardiologist:  Tessa Lerner, DO , [facility] (established care February 2024) Electrophysiologist:  None  Click to update primary MD,subspecialty MD or APP then REFRESH:1}    Chief Complaint  Patient presents with   Precordial pain   Follow-up    History of Present Illness: Marland Kitchen   Erica Moss is a 62 y.o. Caucasian female whose past medical history and cardiovascular risk factors includes: Hypertension, sleep apnea in adult, family history of premature CAD (dad had his first MI at the age of 42 -confounding factors smoker/diabetic), hyperlipidemia, subclinical hypothyroidism mild coronary artery calcification (67.4, 88th percentile as of January 2024), mild aortic atherosclerosis, former smoker, obesity, postmenopausal female.   Patient was referred to the practice for evaluation of chest pain and coronary artery calcification.  She underwent appropriate ischemic workup as outlined below.  Her medical therapy was also uptitrated to help augment her risk factors.  At the last office visit she was informed of the fact that she had hypertensive response to exercise and given clinical suspicion for sleep apnea she was referred to sleep medicine.  She presents today for 27-month follow-up visit.  Since last office visit she denies any anginal chest pain or heart failure symptoms.  Overall functional capacity remains stable.  She was started on antihypertensive medications at the last office visit and states that the home blood pressures are well-controlled with SBP around 130- 135 mmHg.  She also underwent sleep study and was recommended to have a dental appliance but she has been having difficulty getting an appointment.  Family history of premature CAD-father had an MI at the age of 4 but he was also  smoker/diabetic.   Patient states that she has had a stroke in her 30s.  Review of Systems: .   Review of Systems  Cardiovascular:  Negative for chest pain, claudication, irregular heartbeat, leg swelling, near-syncope, orthopnea, palpitations, paroxysmal nocturnal dyspnea and syncope.  Respiratory:  Negative for shortness of breath.   Hematologic/Lymphatic: Negative for bleeding problem.    Studies Reviewed:   EKG: EKG Interpretation Date/Time:  Friday February 25 2023 13:18:25 EST Ventricular Rate:  82 PR Interval:  152 QRS Duration:  90 QT Interval:  392 QTC Calculation: 457 R Axis:   38  Text Interpretation: Normal sinus rhythm When compared with ECG of 29-Dec-2009 09:03, No significant change since last tracing Confirmed by Tessa Lerner ([id number]) on 02/25/2023 1:22:48 PM  Echocardiogram: March 2024: LVEF 55 to 60%. Normal diastolic filling pattern. Trace aortic regurgitation, mild mitral regurgitation, mild tricuspid regurgitation  Stress Testing: Exercise nuclear stress test March 2024: Low risk study  RADIOLOGY: N/A  Risk Assessment/Calculations:   N/A   Labs:    External Labs: Collected: 12/18/2021 provided by PCP. Total cholesterol 211, triglycerides 59, HDL 67, calculated LDL 133, non-HDL 144. TSH 3.09. BUN 18, creatinine 0.85. Sodium 141, potassium 4.2, chloride 103, bicarb 30. AST 18, ALT 14, alkaline phosphatase 93. Hemoglobin 12.8, hematocrit 38.8%  Collected: 06/26/2022 in Care Everywhere. Total cholesterol 149, triglycerides 57, HDL 73, LDL calculated 63.    Physical Exam:    Today's Vitals   02/25/23 1314  BP: (!) 152/96  Pulse: 88  Resp: 16  SpO2: 95%  Weight: 181 lb 3.2 oz (82.2 kg)  Height: 5\' 2"  (1.575 m)   Body mass  index is 33.14 kg/m. Wt Readings from Last 3 Encounters:  02/25/23 181 lb 3.2 oz (82.2 kg)  01/13/23 180 lb (81.6 kg)  11/17/22 180 lb (81.6 kg)    Physical Exam  Constitutional: No distress.  hemodynamically  stable  Neck: No JVD present.  Cardiovascular: Normal rate, regular rhythm, S1 normal and S2 normal. Exam reveals no gallop, no S3 and no S4.  No murmur heard. Pulmonary/Chest: Effort normal and breath sounds normal. No stridor. She has no wheezes. She has no rales.  Abdominal: Soft. Bowel sounds are normal. She exhibits no distension. There is no abdominal tenderness.  Musculoskeletal:        General: No edema.     Cervical back: Neck supple.  Neurological: She is alert and oriented to person, place, and time. She has intact cranial nerves (2-12).  Skin: Skin is warm.   Impression & Recommendation(s):  Impression:   ICD-10-CM   1. Coronary atherosclerosis due to calcified coronary lesion  I25.10 EKG 12-Lead   I25.84     2. Mixed hyperlipidemia  E78.2     3. Benign hypertension  I10     4. Sleep apnea in adult  G47.30        Recommendation(s):  Coronary atherosclerosis due to calcified coronary lesion Continue aspirin 81 mg p.o. daily. Continue Crestor 10 mg p.o. daily. Denies anginal chest pain or heart failure symptoms. EKG: Nonischemic. Overall functional capacity remains stable. Reemphasized the importance of secondary prevention with focus on improving her modifiable cardiovascular risk factors such as glycemic control, lipid management, blood pressure control, weight loss.  Mixed hyperlipidemia Currently on Crestor 10 mg p.o. daily.   She denies myalgia or other side effects. Most recent lipids dated April 2024, independently reviewed as noted above.  LDL levels have improved from 133 mg/dL to 63 mg/dL. Recommend a goal LDL if possible between 55-70 mg/dL  Benign hypertension Started on antihypertensive medications at the last office visit. Tolerated hydrochlorothiazide well. Office blood pressures are not at goal. Home blood pressures are much better controlled. We discussed up titration of medical therapy; however, since home blood pressures are better controlled  we will hold off on adding additional pharmacological therapy at this time.  However she is advised to either call us or PCP if her blood pressures are not better controlled  Sleep apnea in adult Diagnosis sleep apnea since last office visit. Recommended to have a dental appliance-she is still working out the details with regards to coordination of care. I have advised her to either call her insurance to see who else can do this for her within her network or reach out to the sleep provider for further guidance   Orders Placed:  Orders Placed This Encounter  Procedures   EKG 12-Lead    Discussed management of at least 2 chronic comorbid conditions, outside labs from April 2024 independently reviewed, EKG performed and independently interpreted, and coordination of care.  Final Medication List:   No orders of the defined types were placed in this encounter.   There are no discontinued medications.   Current Outpatient Medications:    aspirin EC 81 MG tablet, Take 1 tablet (81 mg total) by mouth daily. Swallow whole., Disp: 30 tablet, Rfl: 12   famotidine (PEPCID) 20 MG tablet, Take 20 mg by mouth daily as needed for heartburn or indigestion., Disp: , Rfl:    hydrochlorothiazide (HYDRODIURIL) 25 MG tablet, TAKE ONE TABLET EVERY MORNING, Disp: 90 tablet, Rfl: 1   rosuvastatin (CRESTOR)  10 MG tablet, TAKE ONE TABLET AT BEDTIME, Disp: 90 tablet, Rfl: 1  Consent:   N/A  Disposition:   1 year sooner if needed  Her questions and concerns were addressed to her satisfaction. She voices understanding of the recommendations provided during this encounter.    Signed, Tessa Lerner, DO, [facility]  [address] [place], [place] [address] 02/25/2023 4:23 PM

## 2023-11-01 ENCOUNTER — Other Ambulatory Visit: Payer: Self-pay | Admitting: Cardiology

## 2023-11-01 DIAGNOSIS — I2510 Atherosclerotic heart disease of native coronary artery without angina pectoris: Secondary | ICD-10-CM
